# Patient Record
Sex: Female | Born: 1965 | Race: White | Hispanic: No | Marital: Married | Smoking: Never smoker
Health system: Southern US, Community
[De-identification: ages and names within clinical notes are randomized; demographics above are authoritative.]

## PROBLEM LIST (undated history)

## (undated) HISTORY — PX: TUBAL LIGATION: SHX77

---

## 2000-04-13 ENCOUNTER — Emergency Department (HOSPITAL_COMMUNITY): Admission: EM | Admit: 2000-04-13 | Discharge: 2000-04-13 | Payer: Self-pay | Admitting: *Deleted

## 2000-10-18 ENCOUNTER — Emergency Department (HOSPITAL_COMMUNITY): Admission: EM | Admit: 2000-10-18 | Discharge: 2000-10-18 | Payer: Self-pay

## 2000-12-14 ENCOUNTER — Emergency Department (HOSPITAL_COMMUNITY): Admission: EM | Admit: 2000-12-14 | Discharge: 2000-12-14 | Payer: Self-pay | Admitting: Emergency Medicine

## 2000-12-14 ENCOUNTER — Encounter: Payer: Self-pay | Admitting: Emergency Medicine

## 2001-09-03 ENCOUNTER — Emergency Department (HOSPITAL_COMMUNITY): Admission: EM | Admit: 2001-09-03 | Discharge: 2001-09-03 | Payer: Self-pay | Admitting: Emergency Medicine

## 2002-08-26 ENCOUNTER — Emergency Department (HOSPITAL_COMMUNITY): Admission: EM | Admit: 2002-08-26 | Discharge: 2002-08-26 | Payer: Self-pay | Admitting: Emergency Medicine

## 2004-11-24 ENCOUNTER — Emergency Department (HOSPITAL_COMMUNITY): Admission: EM | Admit: 2004-11-24 | Discharge: 2004-11-24 | Payer: Self-pay | Admitting: Emergency Medicine

## 2009-08-27 ENCOUNTER — Encounter: Admission: RE | Admit: 2009-08-27 | Discharge: 2009-08-27 | Payer: Self-pay | Admitting: Family Medicine

## 2009-09-05 ENCOUNTER — Encounter: Admission: RE | Admit: 2009-09-05 | Discharge: 2009-09-05 | Payer: Self-pay | Admitting: Family Medicine

## 2011-07-17 ENCOUNTER — Ambulatory Visit (INDEPENDENT_AMBULATORY_CARE_PROVIDER_SITE_OTHER): Payer: BC Managed Care – PPO

## 2011-07-17 DIAGNOSIS — H6500 Acute serous otitis media, unspecified ear: Secondary | ICD-10-CM

## 2011-07-17 DIAGNOSIS — H9209 Otalgia, unspecified ear: Secondary | ICD-10-CM

## 2012-04-15 ENCOUNTER — Ambulatory Visit (INDEPENDENT_AMBULATORY_CARE_PROVIDER_SITE_OTHER): Payer: BC Managed Care – PPO | Admitting: Family Medicine

## 2012-04-15 ENCOUNTER — Ambulatory Visit: Payer: BC Managed Care – PPO

## 2012-04-15 VITALS — BP 130/80 | HR 62 | Temp 98.4°F | Resp 16 | Ht 64.75 in | Wt 209.6 lb

## 2012-04-15 DIAGNOSIS — M79609 Pain in unspecified limb: Secondary | ICD-10-CM

## 2012-04-15 DIAGNOSIS — M79672 Pain in left foot: Secondary | ICD-10-CM

## 2012-04-15 MED ORDER — PREDNISONE 20 MG PO TABS: ORAL_TABLET | ORAL | Status: DC

## 2012-04-15 NOTE — Progress Notes (Signed)
46 yo woman with 3 weeks of left foot pain at outer area despite no trauma and no new activity.  Job is clerical.     Objective:  NAD Left foot is mildly swollen and tender over distal Vth metatarsal. [facility] reading (PRIMARY) by  Dr. Milus Glazier:  Left foot:  No fracture  Assessment: tendonitis  Plan:   Prednisone 20 mg ii qd x 5

## 2012-05-10 ENCOUNTER — Ambulatory Visit (INDEPENDENT_AMBULATORY_CARE_PROVIDER_SITE_OTHER): Payer: BC Managed Care – PPO | Admitting: Family Medicine

## 2012-05-10 VITALS — BP 123/80 | HR 80 | Temp 98.9°F | Resp 18 | Wt 212.0 lb

## 2012-05-10 DIAGNOSIS — M79609 Pain in unspecified limb: Secondary | ICD-10-CM

## 2012-05-10 DIAGNOSIS — M79673 Pain in unspecified foot: Secondary | ICD-10-CM

## 2012-05-10 NOTE — Patient Instructions (Addendum)
I think that you may have a neuroma of your foot.  I am going to refer you to see a sports medicine specialist.  In the meantime be sure to wear shoes with a wide toe- box to allow you foot plenty of space  Morton's Neuroma Neuralgia (nerve pain) or neuroma (benign [non-cancerous] nerve tumor) may develop on any interdigital nerve. The interdigital nerves (nerves between digits) of the foot travel beneath and between the metatarsals (long bones of the fore foot) and pass the nerve endings to the toes. The third interdigital is a common place for a small neuroma to form called Morton's neuroma. Another nerve to be affected commonly is the fourth interdigital nerve. This would be in approximately in the area of the base or ball under the bottom of your fourth toe. This condition occurs more commonly in women and is usually on one side. It is usually first noticed by pain radiating (spreading) to the ball of the foot or to the toes. CAUSES The cause of interdigital neuralgia may be from low grade repetitive trauma (damage caused by an accident) as in activities causing a repeated pounding of the foot (running, jumping etc.). It is also caused by improper footwear or recent loss of the fatty padding on the bottom of the foot. TREATMENT  The condition often resolves (goes away) simply with decreasing activity if that is thought to be the cause. Proper shoes are beneficial. Orthotics (special foot support aids) such as a metatarsal bar are often beneficial. This condition usually responds to conservative therapy, however if surgery is necessary it usually brings complete relief. HOME CARE INSTRUCTIONS   Apply ice to the area of soreness for 15 to 20 minutes, 3 to 4 times per day, while awake for the first 2 days. Put ice in a plastic bag and place a towel between the bag of ice and your skin.  Only take over-the-counter or prescription medicines for pain, discomfort, or fever as directed by your  caregiver. MAKE SURE YOU:   Understand these instructions.  Will watch your condition.  Will get help right away if you are not doing well or get worse. Document Released: 10/04/2000 Document Revised: 09/20/2011 Document Reviewed: 06/28/2005 [facility] Patient Information 2013 [place], [place].

## 2012-05-10 NOTE — Progress Notes (Signed)
[facility] [address] [place] [address] [phone]  Date:  05/10/2012   Name:  Mallory Bell   DOB:  [birth year]   MRN:  [id number]  PCP:  No primary provider on file.    Chief Complaint: Follow-up   History of Present Illness:  Mallory Bell is a 46 y.o. very pleasant female patient who presents with the following:  She was seen here on 04/15/12 with left foot pain without any trauma. She had a negative x-ray: LEFT FOOT - COMPLETE 3+ VIEW  Comparison: None.  Findings: No fracture. The joints are normally spaced and aligned.  The soft tissues are unremarkable.  IMPRESSION:  Normal left foot radiographs   Was diagnosed with tendonitis and treated with prednisone.  Since her last visit the foot has intermittently swollen.  It hurts a little bit, but this also comes and goes.   Her foot is worse at night after she has been on her feet She does not have pain when she gets out of bed in the am.   No new activities.  She has actually been trying to take it easier on her foot and this has not helped.    There is no problem list on file for this patient.   No past medical history on file.  Past Surgical History  Procedure Date  . Tubal ligation     History  Substance Use Topics  . Smoking status: Never Smoker   . Smokeless tobacco: Not on file  . Alcohol Use: No    Family History  Problem Relation Age of Onset  . Depression Mother   . Heart disease Father   . Diabetes Father   . Hypertension Father     No Known Allergies  Medication list has been reviewed and updated.  Current Outpatient Prescriptions on File Prior to Visit  Medication Sig Dispense Refill  . predniSONE (DELTASONE) 20 MG tablet 2 daily with food  10 tablet  1    Review of Systems:  As per HPI- otherwise negative.   Physical Examination: Filed Vitals:   05/10/12 0900  BP: 123/80  Pulse: 80  Temp: 98.9 F (37.2 C)  Resp: 18   Filed Vitals:   05/10/12 0900   Weight: 212 lb (96.163 kg)   There is no height on file to calculate BMI. Ideal Body Weight:    GEN: WDWN, NAD, Non-toxic, A & O x 3, overweight HEENT: Atraumatic, Normocephalic. Neck supple. No masses, No LAD. Ears and Nose: No external deformity. CV: RRR, No M/G/R. No JVD. No thrill. No extra heart sounds. PULM: CTA B, no wheezes, crackles, rhonchi. No retractions. No resp. distress. No accessory muscle use. EXTR: No c/c/e.  Hands, wrists normal (except ganglion cyst right).  Her right foot and ankle are normal.  Left foot is tender over the dorsal 3rd/ 4th metatarsals.  She has a positive squeeze test.  Feet are slightly flat but do have some arch preserved.  Trace edema at the lateral ankle but her ankle is not tender.  No heat, redness or swelling NEURO Normal gait.  PSYCH: Normally interactive. Conversant. Not depressed or anxious appearing.  Calm demeanor.    Assessment and Plan: 1. Foot pain  Ambulatory referral to Sports Medicine   See pt instructions   Abbe Amsterdam, MD

## 2012-05-16 ENCOUNTER — Encounter: Payer: Self-pay | Admitting: Family Medicine

## 2012-05-16 ENCOUNTER — Ambulatory Visit (INDEPENDENT_AMBULATORY_CARE_PROVIDER_SITE_OTHER): Payer: BC Managed Care – PPO | Admitting: Family Medicine

## 2012-05-16 VITALS — BP 144/81 | HR 76 | Temp 98.2°F | Ht 64.0 in | Wt 217.0 lb

## 2012-05-16 DIAGNOSIS — R269 Unspecified abnormalities of gait and mobility: Secondary | ICD-10-CM

## 2012-05-16 DIAGNOSIS — G5760 Lesion of plantar nerve, unspecified lower limb: Secondary | ICD-10-CM | POA: Insufficient documentation

## 2012-05-16 MED ORDER — MELOXICAM 15 MG PO TABS: 15.0000 mg | ORAL_TABLET | Freq: Every day | ORAL | Status: DC

## 2012-05-16 NOTE — Assessment & Plan Note (Signed)
Patient on clinical exam as well as ultrasound has findings consistent with a Morton's neuroma. Patient also has callus formation on the second large toe as well as a Morton's toe which increases the likelihood of this being a problem. Patient was put in sports insults today with a metatarsal pad bilaterally and did have some relief of the pain. Patient is going to try to wear this on a daily basis as well as taking meloxicam daily for the next 2 weeks. After that time patient will followup in 2-4 weeks for further evaluation. If she continues to be tender at that time we'll consider corticosteroid injection. If the sports insoles seem to be helping though then we'll consider doing custom orthotics.

## 2012-05-16 NOTE — Patient Instructions (Signed)
Very nice to meet you. I when she to try these insoles in some tennis shoes and see if this helps. I'm sending you in a medicine called meloxicam. Take one pill daily for the next 2 weeks then as needed thereafter. Come back in 4 weeks. If this seems to help then we will likely do custom orthotics. This did not seem to help then we need to consider doing an injection into the Morton's neuroma.  Morton's Neuroma in Sports  (Interdigital Plantar Neuroma) Morton's neuroma is a condition of the nervous system that results in pain or loss of feeling in the toes. The disease is caused by the bones of the foot squeezing the nerve that runs between two toes (interdigital nerve). The third and fourth toes are most likely to be affected by this disease. SYMPTOMS   Tingling, numbness, burning, or electric shocks in the front of the foot, often involving the third and fourth toes, although it may involve any other pair of toes.  Pain and tenderness in the front of the foot, that gets worse when walking.  Pain that gets worse when pressure is applied to the foot (wearing shoes).  Severe pain in the front of the foot, when standing on the front of the foot (on tiptoes), such as with running, jumping, pivoting, or dancing. CAUSES  Morton's neuroma is caused by swelling of the nerve between two toes. This swelling causes the nerve to be pinched between the bones of the foot. RISK INCREASES WITH:  Recurring foot or ankle injuries.  Poor fitting or worn shoes, with minimal padding and shock absorbers.  Loose ligaments of the foot, causing thickening of the nerve.  Poor foot strength and flexibility. PREVENTION  Warm up and stretch properly before activity.  Maintain physical fitness:  Foot and ankle flexibility.  Muscle strength and endurance.  Cardiovascular fitness.  Wear properly fitted and padded shoes.  Wear arch supports (orthotics), when needed. PROGNOSIS  If treated properly,  Morton's neuroma can usually be cured with non-surgical treatment. For certain cases, surgery may be needed. RELATED COMPLICATIONS  Permanent numbness and pain in the foot.  Inability to participate in athletics, because of pain. TREATMENT Treatment first involves stopping any activities that make the symptoms worse. The use of ice and medicine will help reduce pain and inflammation. Wearing shoes with a wide toe box, and an orthotic arch support or metatarsal bar, may also reduce pain. Your caregiver may give you a corticosteroid injection, to further reduce inflammation. If non-surgical treatment is unsuccessful, surgery may be needed. Surgery to fix Morton's neuroma is often performed as an outpatient procedure, meaning you can go home the same day as the surgery. The procedure involves removing the source of pressure on the nerve. If it is necessary to remove the nerve, you can expect persistent numbness. MEDICATION  If pain medicine is needed, nonsteroidal anti-inflammatory medicines (aspirin and ibuprofen), or other minor pain relievers (acetaminophen), are often advised.  Do not take pain medicine for 7 days before surgery.  Prescription pain relievers are usually prescribed only after surgery. Use only as directed and only as much as you need.  Corticosteroid injections are used in extreme cases, to reduce inflammation. These injections should be done only if necessary, because they may be given only a limited number of times. HEAT AND COLD  Cold treatment (icing) should be applied for 10 to 15 minutes every 2 to 3 hours for inflammation and pain, and immediately after activity that aggravates your  symptoms. Use ice packs or an ice massage.  Heat treatment may be used before performing stretching and strengthening activities prescribed by your caregiver, physical therapist, or athletic trainer. Use a heat pack or a warm water soak. SEEK MEDICAL CARE IF:   Symptoms get worse or do not  improve in 2 weeks, despite treatment.  After surgery you develop increasing pain, swelling, redness, increased warmth, bleeding, drainage of fluids, or fever.  New, unexplained symptoms develop. (Drugs used in treatment may produce side effects.) Document Released: 05/05/2005 Document Revised: 09/20/2011 Document Reviewed: 10/10/2008 [facility] Patient Information 2013 [place], [place].

## 2012-05-16 NOTE — Progress Notes (Signed)
Subjective Patient is a very pleasant 46 year old female who is coming in with left foot pain for the last 5 weeks. Patient states she does not remember any injury. Patient states that the pain appears to be mostly on the dorsal aspect of the foot. Patient states that it is a very specific area that does have some swelling and even some numbness of her toes distal to the area. Patient has seen other physicians and has been diagnosed with a tendon rupture as well as potential Morton's neuroma. Vision is here though for further evaluation and treatment. Patient has had different things such as changing shoes which did not make any difference as well as icing and anti-inflammatories with no significant improvement. Patient is able to walk but does have pain from time to time. Patient states that the pain is much worse with palpation of the area or increase walking duration. Patient denies any injury to the area.patient describes pain as a very sharp sensation that does have some mild radiation that is worse once again with palpation. Patient denies anything seems to help.  Past medical history No past medical history on file. Past Surgical History  Procedure Date  . Tubal ligation    Family History  Problem Relation Age of Onset  . Depression Mother   . Heart disease Father   . Diabetes Father   . Hypertension Father    History  Substance Use Topics  . Smoking status: Never Smoker   . Smokeless tobacco: Not on file  . Alcohol Use: No   Allergies  Allergen Reactions  . Amoxicillin Rash   Current outpatient prescriptions:meloxicam (MOBIC) 15 MG tablet, Take 1 tablet (15 mg total) by mouth daily., Disp: 30 tablet, Rfl: 2;  predniSONE (DELTASONE) 20 MG tablet, 2 daily with food, Disp: 10 tablet, Rfl: 1   Physical exam Blood pressure 144/81, pulse 76, temperature 98.2 F (36.8 C), temperature source Oral, height 5\' 4"  (1.626 m), weight 217 lb (98.431 kg), last menstrual period  05/03/2012. General:No apparent distress alert and oriented x3, very pleasant mood and affect Skin: Clean dry intact with no signs of infection a rash Neuro: Cranial nerves II through XII intact as well as neurovascularly intact in all extremities with 2+ DTRs. Respiratory: Patient is in full sentences and does not appear short of breath Left foot exam: Patient does have what appears on exam to be a lipoma in the peroneal tendon just posterior and inferior to the lateral malleolus. This measures approximately 1 cm in diameter. This is nontender to exam. Patient has full range of motion of her ankle and is nontender overall bony prominence. Patient is significantly though tender between toes 3 and 4 on the dorsal aspect of the foot. Patient has a positive squeeze test of the foot. Patient does have trace effusion of the dorsal aspect of the foot in this vicinity. Patient is neurovascularly intact distally. Patient does have collection of the transverse arch as well as some on the longitudinal arch. Patient does not have any overpronation over supination though.  Ultrasound was done and interpreted by me today. Patient's ultrasound does show a Morton's neuroma with hypoechoic changes surrounding it and is significant enlargement between the third and fourth metatarsals. This area is very tender on exam patient does have a positive Mulder sign.

## 2012-06-13 ENCOUNTER — Ambulatory Visit: Payer: BC Managed Care – PPO | Admitting: Family Medicine

## 2012-07-18 ENCOUNTER — Ambulatory Visit: Payer: Self-pay | Admitting: Family Medicine

## 2012-09-05 ENCOUNTER — Ambulatory Visit (INDEPENDENT_AMBULATORY_CARE_PROVIDER_SITE_OTHER): Payer: BC Managed Care – PPO | Admitting: Family Medicine

## 2012-09-05 VITALS — BP 120/70 | Ht 65.0 in | Wt 200.0 lb

## 2012-09-05 DIAGNOSIS — R269 Unspecified abnormalities of gait and mobility: Secondary | ICD-10-CM

## 2012-09-05 NOTE — Progress Notes (Signed)
Patient is following up for bilateral foot pain.  Subjective: Patient was seen previously in May 16, 2012 and was diagnosed with a Morton's neuroma on physical exam in muscle skeletal ultrasound. Patient was fitted with temporary sports insoles with metatarsal pads and had near resolution of pain. Since this time patient sports and soles has broken down but she still is having considerable less pain than she ever had prior to first visit. Patient is here for custom orthotics. Patient is not taking any medications at this time.  Physical exam Blood pressure 120/70, height 5\' 5"  (1.651 m), weight 200 lb (90.719 kg). General:No apparent distress alert and oriented x3, very pleasant mood and affect  Skin: Clean dry intact with no signs of infection a rash  Neuro: Cranial nerves II through XII intact as well as neurovascularly intact in all extremities with 2+ DTRs.   Left foot exam: Nontender to exam. Patient has full range of motion of her ankle and is nontender overall bony prominence. Patient has no effusion which was previously seen. Patient is neurovascularly intact distally. Patient does have collection of the transverse arch as well as some on the longitudinal  Barefoot patient walking does have some overpronation of the hindfoot with some mild external rotation of the right greater than left leg.  Patient was fitted for a : standard, cushioned, semi-rigid orthotic. The orthotic was heated and afterward the patient stood on the orthotic blank positioned on the orthotic stand. The patient was positioned in subtalar neutral position and 10 degrees of ankle dorsiflexion in a weight bearing stance. After completion of molding, a stable base was applied to the orthotic blank. The blank was ground to a stable position for weight bearing. Size:8 Base:EVA Posting: metatarsal pad bilaterally.  Additional orthotic padding: none  Patient ambulated a very neutral position with no significant  overpronation once placed in the orthotic. Patient stated that this felt very comfortable.

## 2012-09-05 NOTE — Assessment & Plan Note (Signed)
Patient to do with custom orthotics today. Patient will wear these and come back in the next 2 weeks if any adjustments need to be done. Encourage to increase the length of use one hour per day over the course of the next 7-10 days.

## 2012-09-09 ENCOUNTER — Ambulatory Visit (INDEPENDENT_AMBULATORY_CARE_PROVIDER_SITE_OTHER): Payer: BC Managed Care – PPO | Admitting: Family Medicine

## 2012-09-09 VITALS — BP 137/81 | HR 96 | Temp 101.8°F | Resp 16 | Ht 64.5 in | Wt 211.0 lb

## 2012-09-09 DIAGNOSIS — J020 Streptococcal pharyngitis: Secondary | ICD-10-CM

## 2012-09-09 MED ORDER — AZITHROMYCIN 250 MG PO TABS: ORAL_TABLET | ORAL | Status: DC

## 2012-09-09 NOTE — Progress Notes (Signed)
[facility] [address], [place] [address] [phone]  Date:  09/09/2012   Name:  Mallory Bell   DOB:  1966-03-16   MRN:  [id number]  PCP:  No primary provider on file.    Chief Complaint: Fever   History of Present Illness:  Mallory Bell is a 47 y.o. very pleasant female patient who presents with the following:  She is here today with possible strep. Today is Saturday. She was with her daughter on Wednesday of this week- then her daughter was diagnosed with strep throat yesterday. Last night Lashan noted that she was strarting to get sick- she did not sleep well and felt achy.    She noted a ST but took ibuprofen this am which did help She feesl nauseated but no vomiting No cough No abdominal pain She does have a fever and a HA- her temp at home was 100.9- she took some ibuprofen- 600 mg around 0800 today She is otherwise generally healthy Patient Active Problem List  Diagnosis  . Morton's neuroma  . Abnormality of gait    No past medical history on file.  Past Surgical History  Procedure Laterality Date  . Tubal ligation      History  Substance Use Topics  . Smoking status: Never Smoker   . Smokeless tobacco: Not on file  . Alcohol Use: No    Family History  Problem Relation Age of Onset  . Depression Mother   . Bipolar disorder Mother   . Heart disease Father   . Diabetes Father   . Hypertension Father   . Hypertension Maternal Grandmother   . Thyroid disease Maternal Grandmother   . Heart disease Maternal Grandfather   . Hypertension Paternal Grandmother     Allergies  Allergen Reactions  . Amoxicillin Rash    Medication list has been reviewed and updated.  Current Outpatient Prescriptions on File Prior to Visit  Medication Sig Dispense Refill  . meloxicam (MOBIC) 15 MG tablet Take 1 tablet (15 mg total) by mouth daily.  30 tablet  2  . predniSONE (DELTASONE) 20 MG tablet 2 daily with food  10 tablet  1   No current  facility-administered medications on file prior to visit.    Review of Systems:  As per HPI- otherwise negative.   Physical Examination: Filed Vitals:   09/09/12 1021  BP: 137/81  Pulse: 96  Temp: 101.8 F (38.8 C)  Resp: 16   Filed Vitals:   09/09/12 1021  Height: 5' 4.5" (1.638 m)  Weight: 211 lb (95.709 kg)   Body mass index is 35.67 kg/(m^2). Ideal Body Weight: Weight in (lb) to have BMI = 25: 147.6  GEN: WDWN, NAD, Non-toxic, A & O x 3, overweight HEENT: Atraumatic, Normocephalic. Neck supple. No masses, No LAD.  Bilateral TM wnl, oropharynx shows injection and tonsilar swelling on the right.  No to minimal exudate, no sign of abscess.  PEERL,EOMI.   Ears and Nose: No external deformity. CV: RRR, No M/G/R. No JVD. No thrill. No extra heart sounds. PULM: CTA B, no wheezes, crackles, rhonchi. No retractions. No resp. distress. No accessory muscle use. ABD: S, NT, ND. No rebound. No HSM. EXTR: No c/c/e NEURO Normal gait.  PSYCH: Normally interactive. Conversant. Not depressed or anxious appearing.  Calm demeanor.   Pt was not able to tolerate a throat swab (she was afraid of vomiting), and she declined any tylenol today for her fever Assessment and Plan:  Streptococcal sore throat - Plan: azithromycin (ZITHROMAX) 250 MG tablet  Treat for presumptive strep throat today- we were unable to get a throat swab today.  Amoxicillin allergy so will treat with azithromycin  See patient instructions for more details.      Abbe Amsterdam, MD

## 2012-09-09 NOTE — Patient Instructions (Addendum)
Use the antibiotic as directed, and use ibuprofen and /or tylenol as needed for your fever and other symptoms.  If you are not better within 36 hours please let me know- Sooner if worse.   Push fluids and rest.  Stay away from others for at least 24 hours

## 2012-11-08 ENCOUNTER — Ambulatory Visit (INDEPENDENT_AMBULATORY_CARE_PROVIDER_SITE_OTHER): Payer: BC Managed Care – PPO | Admitting: Family Medicine

## 2012-11-08 VITALS — BP 122/76 | HR 78 | Temp 99.4°F | Resp 18 | Ht 64.5 in | Wt 213.0 lb

## 2012-11-08 DIAGNOSIS — J209 Acute bronchitis, unspecified: Secondary | ICD-10-CM

## 2012-11-08 DIAGNOSIS — J208 Acute bronchitis due to other specified organisms: Secondary | ICD-10-CM

## 2012-11-08 MED ORDER — HYDROCODONE-HOMATROPINE 5-1.5 MG/5ML PO SYRP: 5.0000 mL | ORAL_SOLUTION | ORAL | Status: DC | PRN

## 2012-11-08 MED ORDER — BENZONATATE 100 MG PO CAPS: ORAL_CAPSULE | ORAL | Status: DC

## 2012-11-08 MED ORDER — AZITHROMYCIN 250 MG PO TABS: ORAL_TABLET | ORAL | Status: DC

## 2012-11-08 NOTE — Progress Notes (Signed)
Subjective: 47 year old lady with history of having an upper sparked or infections started over the weekend. Monday she began coughing badly. The cough keeps her awake. She feels like stuff is there in her throat but just won't come up. She does not smoke. She does have a history of moderate number of these infections. No one else at home is sick. She's not bringing up much phlegm. She does feel rattling and wheezing.  Objective: Her left TM is normal. Right is partially occluded by cerumen and although she says that she has a small hole in the trauma cannot see it. Her. Chest is clear to auscultation except for when she does forced expiration there is a little rattling over the trachea. Heart regular without murmurs.  Assessment Viral URI and bronchitis  Plan: Treat symptomatically. Off work today. If not improving over the next several days take Zithromax, but don't think this at this time hopefully antibiotics can be avoided.

## 2012-11-08 NOTE — Patient Instructions (Addendum)
Drink lots of fluids and get enough rest  Off work today  Use the cough medicines as directed  Take an OTC antihistamine (claritin, zyrtec, allegra to reduce drainage)  If not improving over next 3 days take the Zithromax.  Otherwise discard the prescription.

## 2013-02-13 ENCOUNTER — Ambulatory Visit: Payer: BC Managed Care – PPO

## 2013-02-13 ENCOUNTER — Other Ambulatory Visit: Payer: Self-pay | Admitting: Family Medicine

## 2013-02-13 ENCOUNTER — Ambulatory Visit (INDEPENDENT_AMBULATORY_CARE_PROVIDER_SITE_OTHER): Payer: BC Managed Care – PPO | Admitting: Family Medicine

## 2013-02-13 ENCOUNTER — Ambulatory Visit (HOSPITAL_COMMUNITY)
Admission: RE | Admit: 2013-02-13 | Discharge: 2013-02-13 | Disposition: A | Discharge status: Home / Self Care | Payer: BC Managed Care – PPO | Source: Ambulatory Visit | Attending: Family Medicine | Admitting: Family Medicine

## 2013-02-13 VITALS — BP 112/72 | HR 60 | Temp 98.0°F | Resp 18 | Ht 65.0 in | Wt 219.0 lb

## 2013-02-13 DIAGNOSIS — M25569 Pain in unspecified knee: Secondary | ICD-10-CM

## 2013-02-13 DIAGNOSIS — M25561 Pain in right knee: Secondary | ICD-10-CM

## 2013-02-13 DIAGNOSIS — M79609 Pain in unspecified limb: Secondary | ICD-10-CM | POA: Insufficient documentation

## 2013-02-13 MED ORDER — MELOXICAM 7.5 MG PO TABS: 7.5000 mg | ORAL_TABLET | Freq: Every day | ORAL | Status: DC

## 2013-02-13 NOTE — Progress Notes (Signed)
[facility] [address] [place] [address] [phone]  Date:  02/13/2013   Name:  Mallory Bell   DOB:  1966-03-09   MRN:  [id number]  PCP:  Nilda Simmer, MD    Chief Complaint: Leg Pain   History of Present Illness:  Mallory Bell is a 47 y.o. very pleasant female patient who presents with the following:  She is here with right leg pain.  She has pain in the medial knee.  She has noted this for a couple of days, but is not aware of any injury, contusion, or twist.  The pain comes and goes, and is sharp and acute when it occurs. The knee does not feel unstable.  However, she did feel like it was swollen along the medial joint line last night- now resolved.   No clicking or popping.    No history of DVT/ PE, no travel Never a smoker, not on any hormones.    Otherwise she feels well, no SOB, no CP, no fever.  No prior history of problems with her knee. No unusual exercise.  She does exercise about 3x a week- she will walk on a treadmill or use the elliptical machine, no issues in the past.    She has had some issues with her left foot in the past, but her foot is ok now.   She is otherwise generally healthy except for overweight  Patient Active Problem List   Diagnosis Date Noted  . Morton's neuroma 05/16/2012  . Abnormality of gait 05/16/2012    History reviewed. No pertinent past medical history.  Past Surgical History  Procedure Laterality Date  . Tubal ligation      History  Substance Use Topics  . Smoking status: Never Smoker   . Smokeless tobacco: Never Used  . Alcohol Use: No    Family History  Problem Relation Age of Onset  . Depression Mother   . Bipolar disorder Mother   . Heart disease Father   . Diabetes Father   . Hypertension Father   . Hypertension Maternal Grandmother   . Thyroid disease Maternal Grandmother   . Heart disease Maternal Grandfather   . Hypertension Paternal Grandmother     Allergies  Allergen Reactions   . Amoxicillin Rash    Medication list has been reviewed and updated.  Current Outpatient Prescriptions on File Prior to Visit  Medication Sig Dispense Refill  . azithromycin (ZITHROMAX) 250 MG tablet Take 2 pills once, then 1 pill a day for 4 days more  6 tablet  0  . benzonatate (TESSALON) 100 MG capsule Use 1-2 tablets 3 times daily as necessary for cough. May be used with other cough medicines if needed.  30 capsule  0  . HYDROcodone-homatropine (HYCODAN) 5-1.5 MG/5ML syrup Take 5 mLs by mouth every 4 (four) hours as needed for cough.  120 mL  0   No current facility-administered medications on file prior to visit.    Review of Systems:  As per HPI- otherwise negative.   Physical Examination: Filed Vitals:   02/13/13 0813  BP: 112/72  Pulse: 60  Temp: 98 F (36.7 C)  Resp: 18   Filed Vitals:   02/13/13 0813  Height: 5\' 5"  (1.651 m)  Weight: 219 lb (99.338 kg)   Body mass index is 36.44 kg/(m^2). Ideal Body Weight: Weight in (lb) to have BMI = 25: 149.9  GEN: WDWN, NAD, Non-toxic, A & O x 3, obese HEENT: Atraumatic,  Normocephalic. Neck supple. No masses, No LAD. Ears and Nose: No external deformity. CV: RRR, No M/G/R. No JVD. No thrill. No extra heart sounds. PULM: CTA B, no wheezes, crackles, rhonchi. No retractions. No resp. distress. No accessory muscle use. EXTR: No c/c/e NEURO Normal gait.  PSYCH: Normally interactive. Conversant. Not depressed or anxious appearing.  Calm demeanor.  Right knee: no effusion or redness.  Mild tenderness along the medial joint line and medial knee area.  Knee is stable, normal ROM.  Normal quad and hamstring strength and patellar DTR  [facility] reading (PRIMARY) by  Dr. Patsy Lager. Right knee: minimal degenerative change, no acute fracture RIGHT KNEE - 3 VIEW  Comparison: None.  Findings: No fracture or bone lesion. The knee joint is normally spaced and aligned. Evidence of a subchondral cyst along the dorsal patella with a minimal  the inferior marginal patellar osteophyte. No other degenerative changes evident. The lateral view is somewhat rotated. Allowing for this, there is no convincing joint effusion.  IMPRESSION: Mild patellofemoral joint space compartment degenerative change. No other abnormalities.  Clinically significant discrepancy from primary report, if provided: None  Assessment and Plan: Pain in right knee - Plan: Lower Extremity Venous Duplex Right, meloxicam (MOBIC) 7.5 MG tablet, CANCELED: DG Knee Complete 4 Views Right  Knee pain- suspect a strain of the MCL, also consider meniscal pathology.  Will rule- out DVT and baker's cyst with an ultrasound today.  Placed in hinged knee brace, mobic as needed. Plan to have her call or email if not better in 1 week or so- in this case I will refer to ortho.  Let me know sooner if worse.    Signed Abbe Amsterdam, MD  Received her ultrasound report- negative for DVT or Baker's cyst.  Let her know these results. Continue plan as above.

## 2013-02-13 NOTE — Progress Notes (Signed)
*  PRELIMINARY RESULTS* Vascular Ultrasound Right lower extremity venous duplex has been completed.  Preliminary findings: negative for DVT and baker's cyst.  Called report to Dr. Patsy Lager.    Farrel Demark, RDMS, RVT  02/13/2013, 11:17 AM

## 2013-02-13 NOTE — Patient Instructions (Addendum)
You may go today to [facility] today for the Ultrasound. You may park in the [place] section A entrance (use valet parking) if you go now you will be worked in for the 10 am appt. I will call you with your results later on today .  Use the mobic as needed for pain, and wear the knee brace as needed.  You may also use ice on your knee.  If you are not better in the next week or two, please let me know Driving directions to [facility] [id number]  [phone]  - more info    [address]  [place], [place] [address]     1. Head south on [address] toward [address]      0.5 mi    2. Sharp left onto [address]      0.6 mi    3. Turn left onto the [address] ramp      0.2 mi    4. Merge onto [address]      3.0 mi    5. Continue straight to stay on [address] E      0.4 mi    6. Slight left to stay on [address] E      1.2 mi    7. Turn right onto [address]      0.1 mi    8. Turn left onto [address]      361 ft    9. Take the 1st left onto [address]  Destination will be on the right

## 2013-02-22 ENCOUNTER — Telehealth: Payer: Self-pay

## 2013-02-22 NOTE — Telephone Encounter (Signed)
LMOM for pt to CB. Dr Patsy Lager requests that we ask pt for more details about knee status. Also ask pt if she has any metal in her body, aneurism or device, or chance of pregnancy. Advise pt tha ins probably will not want to cover MRI until after 4-6 wks of conservative tx, but we can try or could refer to ortho who could prob get MRI approved.

## 2013-02-22 NOTE — Telephone Encounter (Signed)
Dr Patsy Lager,   The knee is no better.   Patient is requesting an order for MRI.   CBN:  [phone]

## 2013-02-23 ENCOUNTER — Telehealth: Payer: Self-pay

## 2013-02-23 DIAGNOSIS — M25561 Pain in right knee: Secondary | ICD-10-CM

## 2013-02-23 NOTE — Telephone Encounter (Signed)
She does prefer ortho, this is duplicate message.

## 2013-02-23 NOTE — Telephone Encounter (Signed)
Sent ortho referral.

## 2013-02-23 NOTE — Telephone Encounter (Signed)
Called again- LM.  Please give [place] a call or email with her preference- MRI order or ortho referral

## 2013-02-23 NOTE — Telephone Encounter (Signed)
Patient called in with a message for Dr. Ermalinda Barrios in response to a phone message Dr. Ermalinda Barrios left for her.  She stated that going to the ortho before an MRI would be fine.  She asked if she could be referred to the same ortho that she went to before for her foot.  The ortho was through [facility] off of [address].  Her number is [phone].

## 2013-03-05 ENCOUNTER — Ambulatory Visit (INDEPENDENT_AMBULATORY_CARE_PROVIDER_SITE_OTHER): Payer: BC Managed Care – PPO | Admitting: Family Medicine

## 2013-03-05 VITALS — BP 145/86 | Ht 64.0 in | Wt 200.0 lb

## 2013-03-05 DIAGNOSIS — M25569 Pain in unspecified knee: Secondary | ICD-10-CM

## 2013-03-05 DIAGNOSIS — R269 Unspecified abnormalities of gait and mobility: Secondary | ICD-10-CM

## 2013-03-05 DIAGNOSIS — M25561 Pain in right knee: Secondary | ICD-10-CM | POA: Insufficient documentation

## 2013-03-05 NOTE — Progress Notes (Signed)
  Subjective:    Patient ID: Mallory Bell, female    DOB: Nov 09, 1965, 47 y.o.   MRN: [id number]  HPI  Pain for several weeks. No specific inciting injury. Has never had injury to the right knee. She is not particularly active in sports. Has not started any new walking or other activity program. She has a desk job.  Pain is at the medial side of the right knee. She's been wearing a brace and that seems to help. If she wears the brace for 3 or 4 days then she can go a couple days without the brace and without pain then the pain begins to return. No locking or giving way. No knee swelling. No knee warmth.  Review of Systems No fever, sweats, chills. No unusual weight change.    Objective:   Physical Exam  Vital signs are reviewed GENERAL: Well-developed overweight female no acute distress knee: Right. Full range of motion flexion and extension. Mildly tender to palpation over the origin of the MCL. Joint line is nontender on either side. Negative in very. Normal ligamentous stability to varus and valgus stress. Popliteal space is benign. Is benign. ; Small amount of fluid immediately under the MCL which appears to be intact. The rest of the knee are sounds normal including lateral hand medial meniscus.      Assessment & Plan:  #1 pre-MCL pain/strain on the right. We'll continue brace during the day. Start her on general knee rehabilitation and see her back in 2-3 weeks.

## 2013-03-19 ENCOUNTER — Encounter: Payer: Self-pay | Admitting: Family Medicine

## 2013-03-19 ENCOUNTER — Ambulatory Visit (INDEPENDENT_AMBULATORY_CARE_PROVIDER_SITE_OTHER): Payer: BC Managed Care – PPO | Admitting: Family Medicine

## 2013-03-19 VITALS — BP 132/84 | HR 62 | Ht 64.5 in | Wt 219.0 lb

## 2013-03-19 DIAGNOSIS — M25561 Pain in right knee: Secondary | ICD-10-CM

## 2013-03-19 DIAGNOSIS — M25569 Pain in unspecified knee: Secondary | ICD-10-CM

## 2013-03-21 NOTE — Progress Notes (Signed)
  Subjective:    Patient ID: Mallory Bell, female    DOB: July 25, 1965, 47 y.o.   MRN: [id number]  HPI Followup right knee pain thought to be MCL strain. The brace ecstasy making things worse so she worked for for 5 days and then stopped it. Meloxicam was bothering her stomach so she switched to 800 mg ibuprofen twice a day. She's intermittently been doing the home exercise program but it also seemed to make it more sore the next morning so she's done every other day. In general she is about 50% improved   Review of Systems No locking or giving way of the right knee.    Objective:   Physical Exam  Vital signs reviewed GENERAL: Well-developed overweight female no acute distress KNEE: Right. No effusion. Very mildly tender to palpation over the MCL area ligamentously intact to varus and valgus stress. Lockman is normal no effusion.      Assessment & Plan:  Grade 1 MCL strain improving. I would continue to advance the home exercise program until she can fully completed and all her symptoms have resolved. She can followup when necessary. I think is fine to use 8 her milligrams ibuprofen twice a day problem for the total of 8 weeks when she has stomach issues.

## 2014-05-08 IMAGING — CR DG FOOT COMPLETE 3+V*L*
2 series · 2 of 2 positions shown · non-contrast
Comparison: None.

CLINICAL DATA: Left foot pain for 2 weeks

LEFT FOOT - COMPLETE 3+ VIEW

[AP]
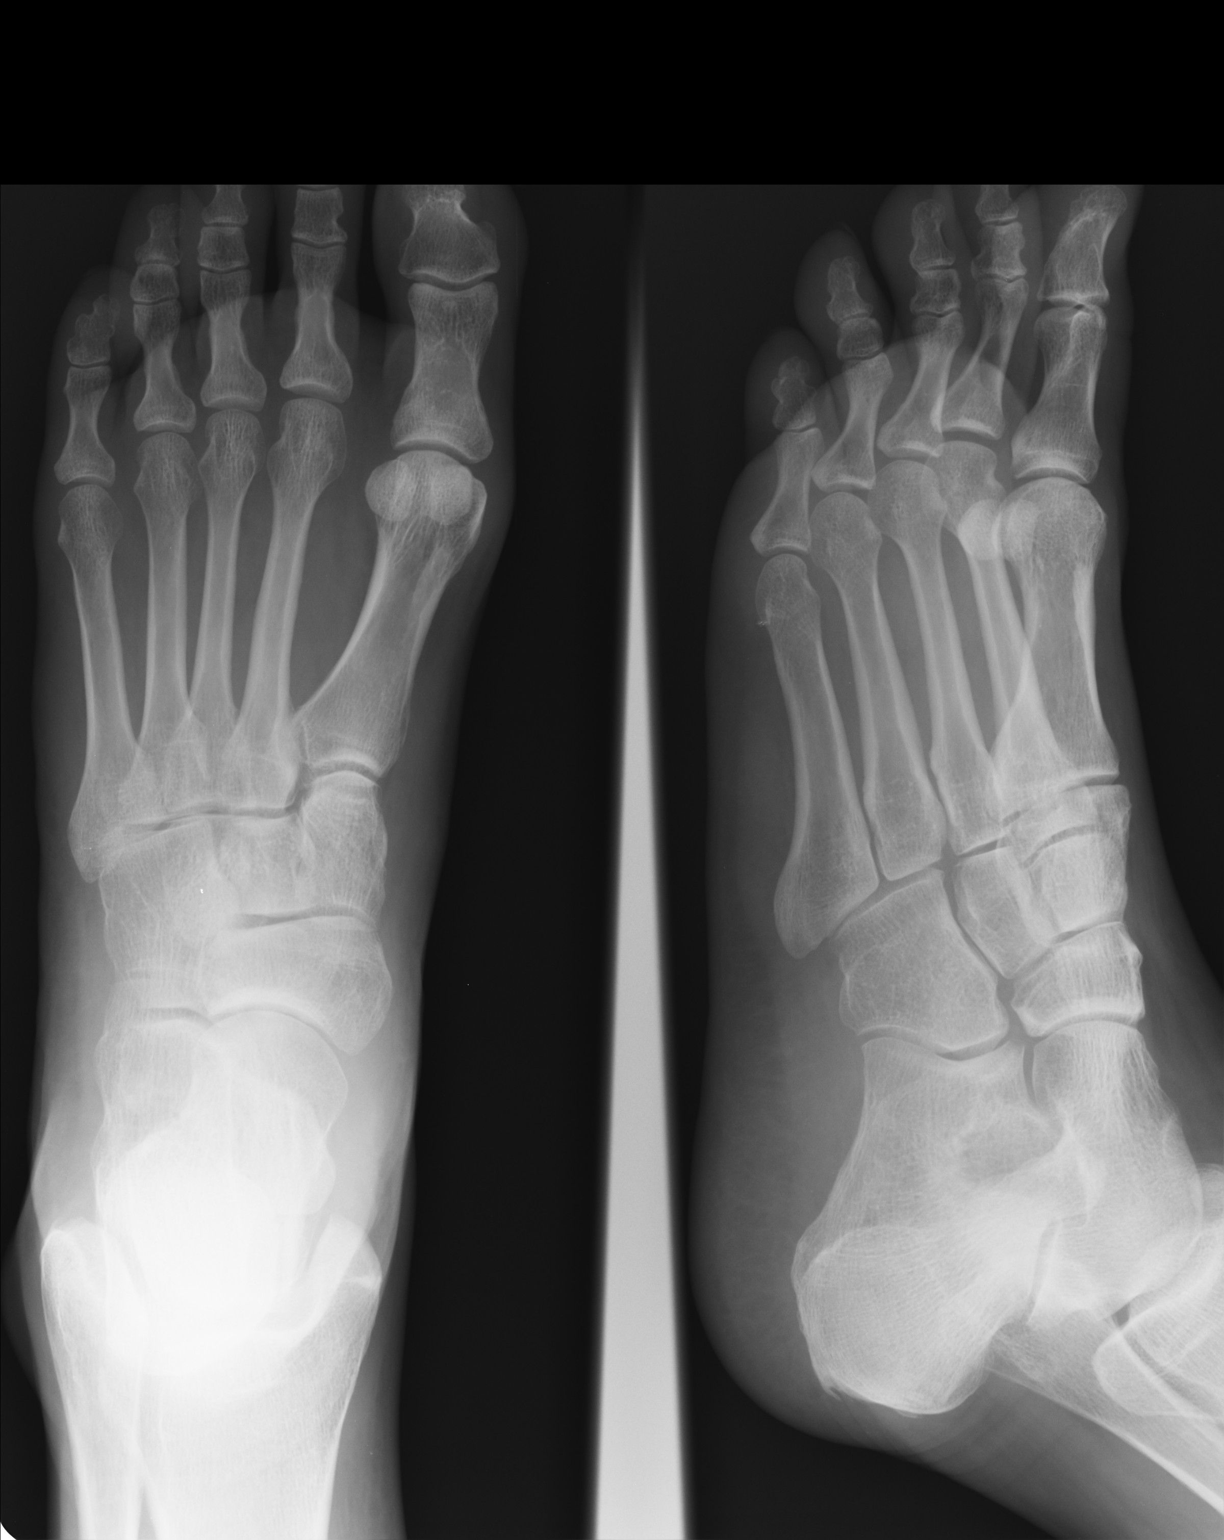

[lateral]
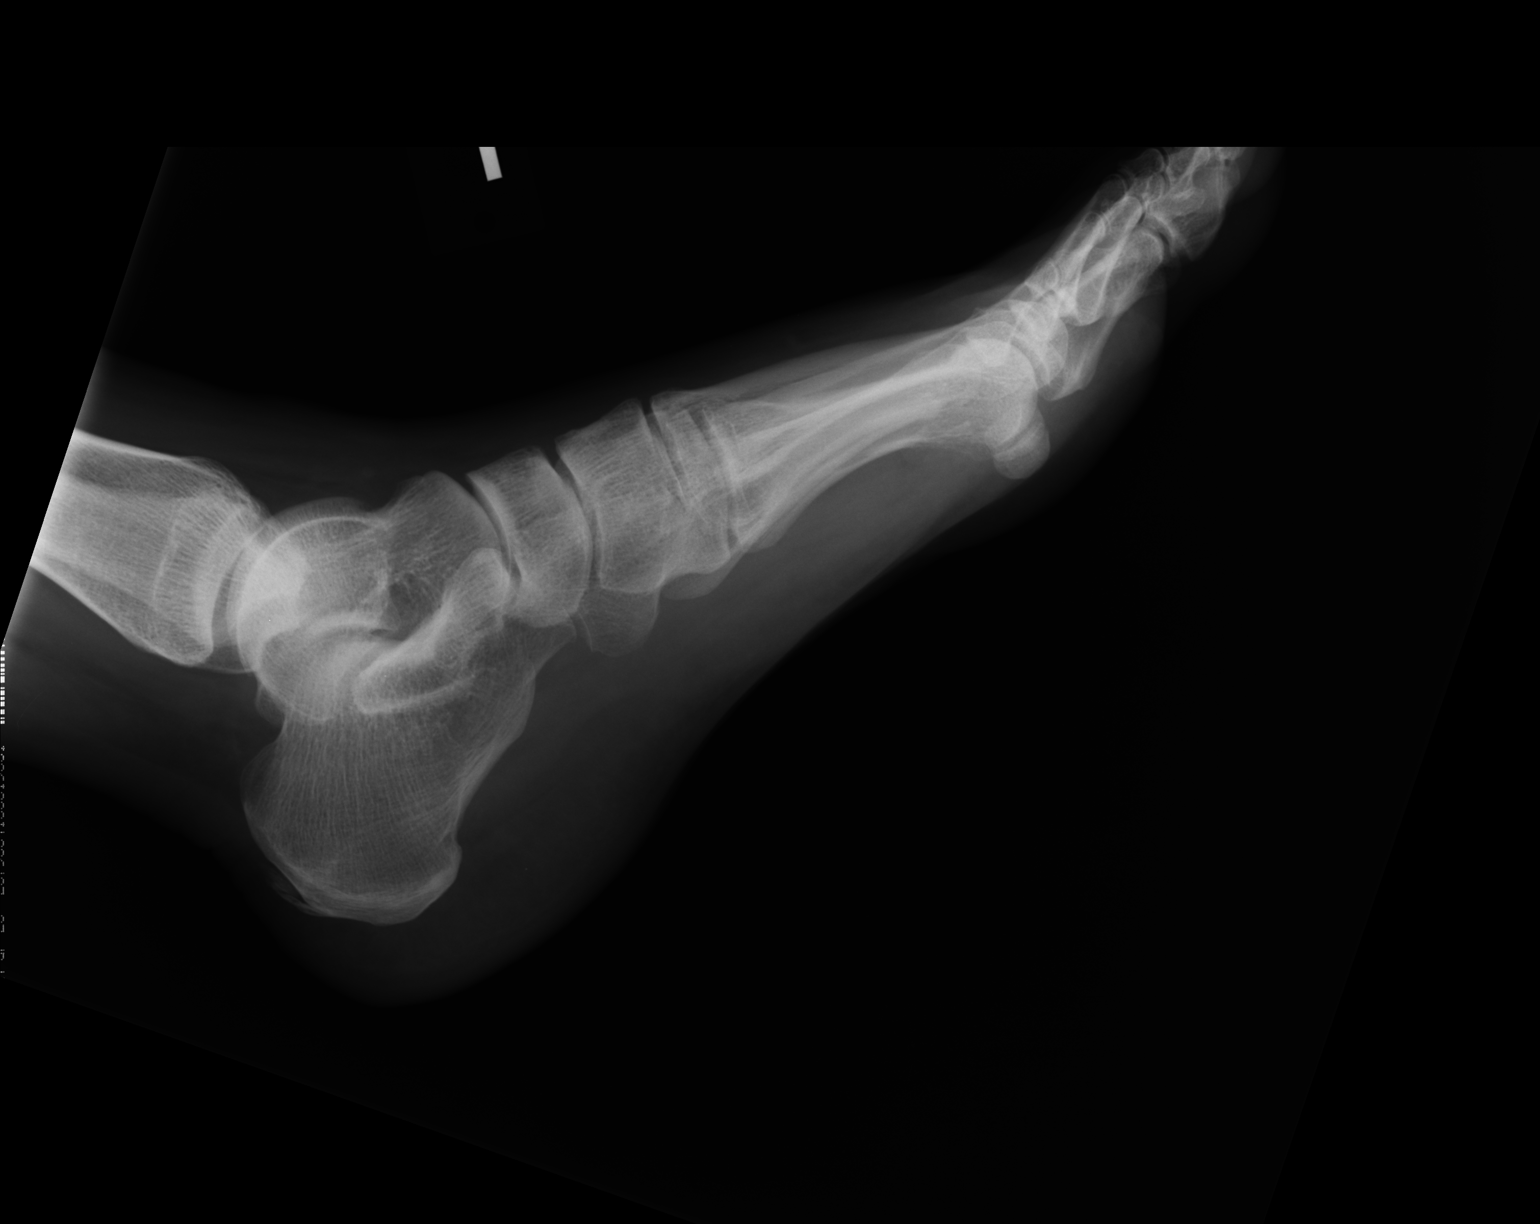

[2 of 2 positions shown; findings below may reference images not displayed]

FINDINGS: No fracture.  The joints are normally spaced and aligned.
The soft tissues are unremarkable.
IMPRESSION: Normal left foot radiographs

## 2014-12-30 ENCOUNTER — Ambulatory Visit (INDEPENDENT_AMBULATORY_CARE_PROVIDER_SITE_OTHER): Payer: BLUE CROSS/BLUE SHIELD | Admitting: Physician Assistant

## 2014-12-30 VITALS — BP 122/68 | HR 70 | Temp 98.4°F | Resp 17 | Ht 64.5 in | Wt 222.0 lb

## 2014-12-30 DIAGNOSIS — R319 Hematuria, unspecified: Secondary | ICD-10-CM | POA: Diagnosis not present

## 2014-12-30 DIAGNOSIS — N3090 Cystitis, unspecified without hematuria: Secondary | ICD-10-CM | POA: Diagnosis not present

## 2014-12-30 LAB — POCT URINALYSIS DIPSTICK
BILIRUBIN UA: NEGATIVE
GLUCOSE UA: NEGATIVE
KETONES UA: NEGATIVE
Nitrite, UA: NEGATIVE
Protein, UA: 30
SPEC GRAV UA: 1.02
UROBILINOGEN UA: 0.2
pH, UA: 6

## 2014-12-30 LAB — POCT UA - MICROSCOPIC ONLY
CASTS, UR, LPF, POC: NEGATIVE
CRYSTALS, UR, HPF, POC: NEGATIVE
Mucus, UA: POSITIVE
Yeast, UA: NEGATIVE

## 2014-12-30 MED ORDER — NITROFURANTOIN MONOHYD MACRO 100 MG PO CAPS: 100.0000 mg | ORAL_CAPSULE | Freq: Two times a day (BID) | ORAL | Status: AC

## 2014-12-30 NOTE — Patient Instructions (Signed)
Drink plenty of water and cranberry juice. Take macrobid twice a day for 10 days. I will call you with results of your urine culture. If you are not getting better in 5-7 days, let me know.

## 2014-12-30 NOTE — Progress Notes (Signed)
[facility] [address] [place] [address] [phone]  Date:  12/30/2014   Name:  Mallory Bell   DOB:  02-12-1966   MRN:  [id number]  PCP:  Nilda Simmer, MD    Chief Complaint: Dysuria   History of Present Illness:  This is a 49 y.o. female who is presenting with hematuria x 1 day. States last night she wiped and saw blood mixed in with her urine. This was very worrisome for her. States it is normal for her to have hematuria with UTIs but usually this is a later symptom. She usually gets dysuria and urinary frequency first and she does have those sx this time. She denies fever, chills, n/v/d, vaginal discharge, new back pain, abdominal pain. Her LMP was 12/09/14. States she did some investigating to see if she was starting her period early but states she is pretty certain the blood is coming from her urethra. She is sexually active with her husband of 22 years. States several years ago she was getting back to back UTIs. She states drinking cranberry juice and apple cider vinegar usually will get rid of her UTI sx - she hasn't had to come in for a UTI in over 3 years.  Review of Systems:  Review of Systems See HPI  Patient Active Problem List   Diagnosis Date Noted  . Right knee pain 03/05/2013  . Morton's neuroma 05/16/2012  . Abnormality of gait 05/16/2012    Prior to Admission medications   Not on File    Allergies  Allergen Reactions  . Amoxicillin Rash    Past Surgical History  Procedure Laterality Date  . Tubal ligation      History  Substance Use Topics  . Smoking status: Never Smoker   . Smokeless tobacco: Never Used  . Alcohol Use: No    Family History  Problem Relation Age of Onset  . Depression Mother   . Bipolar disorder Mother   . Heart disease Father   . Diabetes Father   . Hypertension Father   . Hypertension Maternal Grandmother   . Thyroid disease Maternal Grandmother   . Heart disease Maternal Grandfather   .  Hypertension Paternal Grandmother     Medication list has been reviewed and updated.  Physical Examination:  Physical Exam  Constitutional: She is oriented to person, place, and time. She appears well-developed and well-nourished. No distress.  HENT:  Head: Normocephalic and atraumatic.  Right Ear: Hearing normal.  Left Ear: Hearing normal.  Nose: Nose normal.  Eyes: Conjunctivae and lids are normal. Right eye exhibits no discharge. Left eye exhibits no discharge. No scleral icterus.  Cardiovascular: Normal rate, regular rhythm, normal heart sounds and normal pulses.   No murmur heard. Pulmonary/Chest: Effort normal and breath sounds normal. No respiratory distress. She has no wheezes. She has no rhonchi. She has no rales.  Abdominal: Soft. Normal appearance. There is no tenderness. There is no CVA tenderness.  Musculoskeletal: Normal range of motion.  Neurological: She is alert and oriented to person, place, and time.  Skin: Skin is warm, dry and intact. No lesion and no rash noted.  Psychiatric: She has a normal mood and affect. Her speech is normal and behavior is normal. Thought content normal.   BP 122/68 mmHg  Pulse 70  Temp(Src) 98.4 F (36.9 C) (Oral)  Resp 17  Ht 5' 4.5" (1.638 m)  Wt 222 lb (100.699 kg)  BMI 37.53 kg/m2  SpO2 98%  LMP  12/12/2014  Results for orders placed or performed in visit on 12/30/14  POCT UA - Microscopic Only  Result Value Ref Range   WBC, Ur, HPF, POC 7-15    RBC, urine, microscopic 0-2    Bacteria, U Microscopic small    Mucus, UA positive    Epithelial cells, urine per micros 4-8    Crystals, Ur, HPF, POC neg    Casts, Ur, LPF, POC neg    Yeast, UA neg    Amorphous moderate   POCT urinalysis dipstick  Result Value Ref Range   Color, UA yellow    Clarity, UA hazy    Glucose, UA neg    Bilirubin, UA neg    Ketones, UA neg    Spec Grav, UA 1.020    Blood, UA large    pH, UA 6.0    Protein, UA 30    Urobilinogen, UA 0.2     Nitrite, UA neg    Leukocytes, UA Trace (A) Negative   Assessment and Plan:  1. Cystitis 2. hematuria UA suggestive of UTI. Urine culture pending. Macrobid prescribed. Counseled on hydration. Return if symptoms not starting to improve in 5-7 days. - nitrofurantoin, macrocrystal-monohydrate, (MACROBID) 100 MG capsule; Take 1 capsule (100 mg total) by mouth 2 (two) times daily.  Dispense: 14 capsule; Refill: 0 - Urine culture - POCT UA - Microscopic Only - POCT urinalysis dipstick   Roswell Miners. Dyke Brackett, MHS [facility]  12/30/2014

## 2014-12-31 NOTE — Addendum Note (Signed)
Addended by: Carmelina Dane on: 12/31/2014 04:56 PM   Modules accepted: Kipp Brood

## 2014-12-31 NOTE — Progress Notes (Signed)
  Medical screening examination/treatment/procedure(s) were performed by non-physician practitioner and as supervising physician I was immediately available for consultation/collaboration.     

## 2015-01-01 LAB — URINE CULTURE: Colony Count: 60000

## 2015-02-09 ENCOUNTER — Ambulatory Visit (INDEPENDENT_AMBULATORY_CARE_PROVIDER_SITE_OTHER): Payer: BLUE CROSS/BLUE SHIELD | Admitting: Family Medicine

## 2015-02-09 VITALS — BP 118/70 | HR 63 | Temp 98.5°F | Resp 16 | Ht 65.5 in | Wt 220.4 lb

## 2015-02-09 DIAGNOSIS — R300 Dysuria: Secondary | ICD-10-CM | POA: Diagnosis not present

## 2015-02-09 LAB — POCT URINALYSIS DIPSTICK
BILIRUBIN UA: NEGATIVE
GLUCOSE UA: NEGATIVE
Ketones, UA: NEGATIVE
NITRITE UA: NEGATIVE
Protein, UA: 30
Spec Grav, UA: 1.02
UROBILINOGEN UA: 1
pH, UA: 5.5

## 2015-02-09 LAB — POCT UA - MICROSCOPIC ONLY
CRYSTALS, UR, HPF, POC: NEGATIVE
Casts, Ur, LPF, POC: NEGATIVE
Mucus, UA: NEGATIVE
YEAST UA: NEGATIVE

## 2015-02-09 MED ORDER — CIPROFLOXACIN HCL 500 MG PO TABS: 500.0000 mg | ORAL_TABLET | Freq: Two times a day (BID) | ORAL | Status: DC

## 2015-02-09 NOTE — Patient Instructions (Signed)

## 2015-02-09 NOTE — Progress Notes (Signed)
Subjective:    Patient ID: Levonne Hubert, female    DOB: 11/25/1965, 49 y.o.   MRN: [id number] This chart was scribed for Nilda Simmer, MD by Littie Deeds, Medical Scribe. This patient was seen in Room 12 and the patient's care was started at 8:37 AM.   02/09/2015  Urinary Frequency; Hematuria; and Dysuria   HPI  HPI Comments: Areil T Insalaco is a 49 y.o. female who [facility] complaining of dysuria that started a few days ago. Patient reports having associated urinary frequency, urinary urgency, and mild diarrhea. She had 3 episodes of nocturia last night; she typically does not have any. She has had some hematuria and back pain today, but she attributes this to starting her menstrual cycle today. Patient denies fever, chills, diaphoresis, nausea, vomiting, vaginal discharge, vaginal pain and vaginal irritation. She has been drinking plenty of fluids, mostly water with sweet tea sometimes. She is married. She was seen here 1 month ago by Lanier Clam, PA-C for similar symptoms and was treated with Macrobid. Patient states she used to have UTI's often, but has not had them recently (except last month) after quitting soft drinks.  Urine culture was negative at visit last month.  Prescribed Macrobid.  Patient also has a cyst on her right wrist, but this does not bother her.   Review of Systems  Constitutional: Negative for fever, chills, diaphoresis and fatigue.  Gastrointestinal: Negative for nausea, vomiting and diarrhea.  Genitourinary: Positive for dysuria, urgency, frequency and hematuria. Negative for flank pain, vaginal bleeding, vaginal discharge, vaginal pain, menstrual problem and pelvic pain.  Musculoskeletal: Positive for back pain.    History reviewed. No pertinent past medical history. Past Surgical History  Procedure Laterality Date  . Tubal ligation     Allergies  Allergen Reactions  . Amoxicillin Rash   History   Social History  . Marital  Status: Married    Spouse Name: N/A  . Number of Children: N/A  . Years of Education: N/A   Occupational History  . Not on file.   Social History Main Topics  . Smoking status: Never Smoker   . Smokeless tobacco: Never Used  . Alcohol Use: No  . Drug Use: No  . Sexual Activity: Yes   Other Topics Concern  . Not on file   Social History Narrative        Objective:    BP 118/70 mmHg  Pulse 63  Temp(Src) 98.5 F (36.9 C) (Oral)  Resp 16  Ht 5' 5.5" (1.664 m)  Wt 220 lb 6.4 oz (99.973 kg)  BMI 36.11 kg/m2  SpO2 98%  LMP 02/09/2015 Physical Exam  Constitutional: She is oriented to person, place, and time. She appears well-developed and well-nourished. No distress.  HENT:  Head: Normocephalic and atraumatic.  Mouth/Throat: No oropharyngeal exudate.  Eyes: Conjunctivae are normal. Pupils are equal, round, and reactive to light.  Neck: Neck supple.  Cardiovascular: Normal rate, regular rhythm and normal heart sounds.   No murmur heard. Pulmonary/Chest: Effort normal and breath sounds normal. No respiratory distress. She has no wheezes. She has no rales.  Clear to auscultation bilaterally.   Abdominal: Soft. Bowel sounds are normal. She exhibits no distension and no mass. There is no hepatosplenomegaly. There is no tenderness. There is no rebound, no guarding and no CVA tenderness.  No suprapubic tenderness.  Musculoskeletal: She exhibits no edema.  +ganglion cyst R wrist.  Neurological: She is alert and oriented  to person, place, and time. No cranial nerve deficit.  Skin: Skin is warm and dry. No rash noted. She is not diaphoretic.  Psychiatric: She has a normal mood and affect. Her behavior is normal.  Vitals reviewed.  Results for orders placed or performed in visit on 02/09/15  POCT urinalysis dipstick  Result Value Ref Range   Color, UA dark yellow    Clarity, UA cloudy    Glucose, UA neg    Bilirubin, UA neg    Ketones, UA neg    Spec Grav, UA 1.020     Blood, UA large    pH, UA 5.5    Protein, UA 30    Urobilinogen, UA 1.0    Nitrite, UA neg    Leukocytes, UA small (1+) (A) Negative  POCT UA - Microscopic Only  Result Value Ref Range   WBC, Ur, HPF, POC 2-4    RBC, urine, microscopic TNTC    Bacteria, U Microscopic 1+    Mucus, UA neg    Epithelial cells, urine per micros 1-3    Crystals, Ur, HPF, POC neg    Casts, Ur, LPF, POC neg    Yeast, UA neg        Assessment & Plan:   1. Dysuria    -New. -Send urine culture. -Treat with Cipro while awaiting culture results. -RTC for fever, vomiting, severe flank pain.   Meds ordered this encounter  Medications  . ciprofloxacin (CIPRO) 500 MG tablet    Sig: Take 1 tablet (500 mg total) by mouth 2 (two) times daily.    Dispense:  14 tablet    Refill:  0    No Follow-up on file.   I personally performed the services described in this documentation, which was scribed in my presence. The recorded information has been reviewed and considered.    Kristi Paulita Fujita, M.D. [facility] [address] [place], [place]  [address] [phone] phone [phone] fax

## 2015-02-10 LAB — URINE CULTURE: Colony Count: 65000

## 2015-03-08 IMAGING — CR DG KNEE 3 VIEWS*R*
3 series · 3 of 3 positions shown · non-contrast
Comparison: None.

CLINICAL DATA: Right knee pain.  No known injury.

RIGHT KNEE - 3 VIEW

[AP]
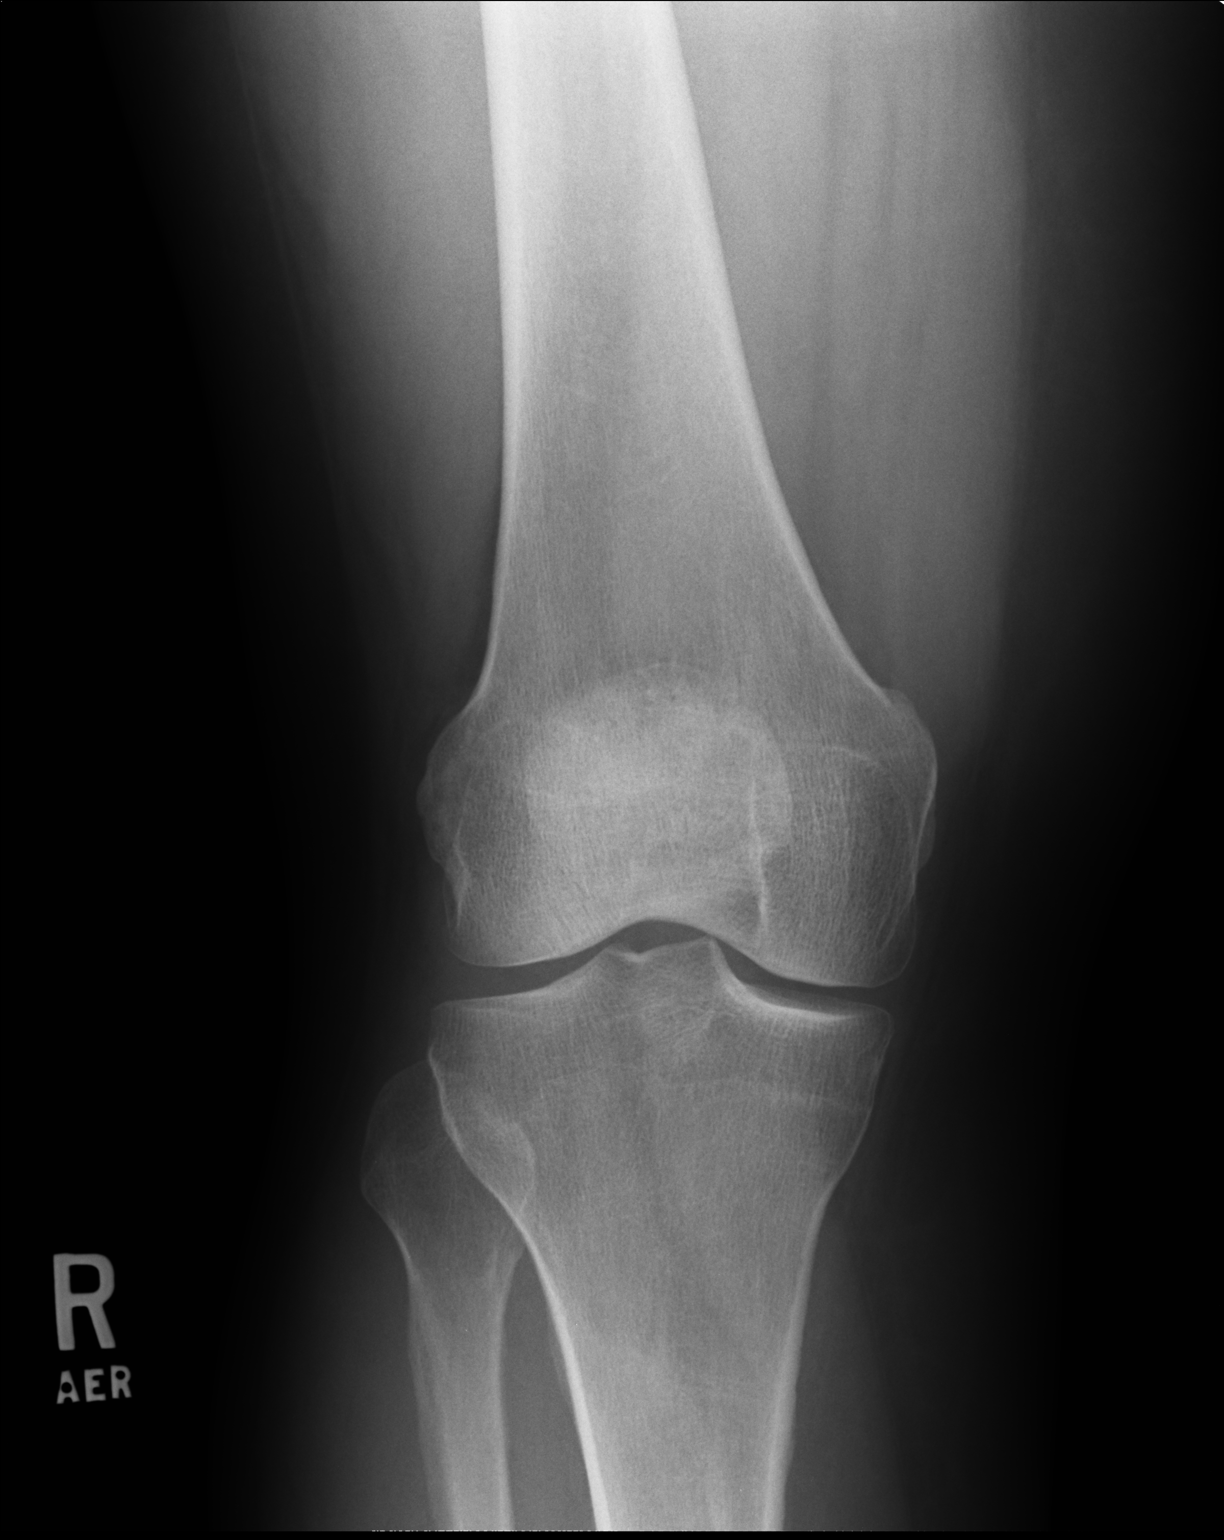

[lateral]
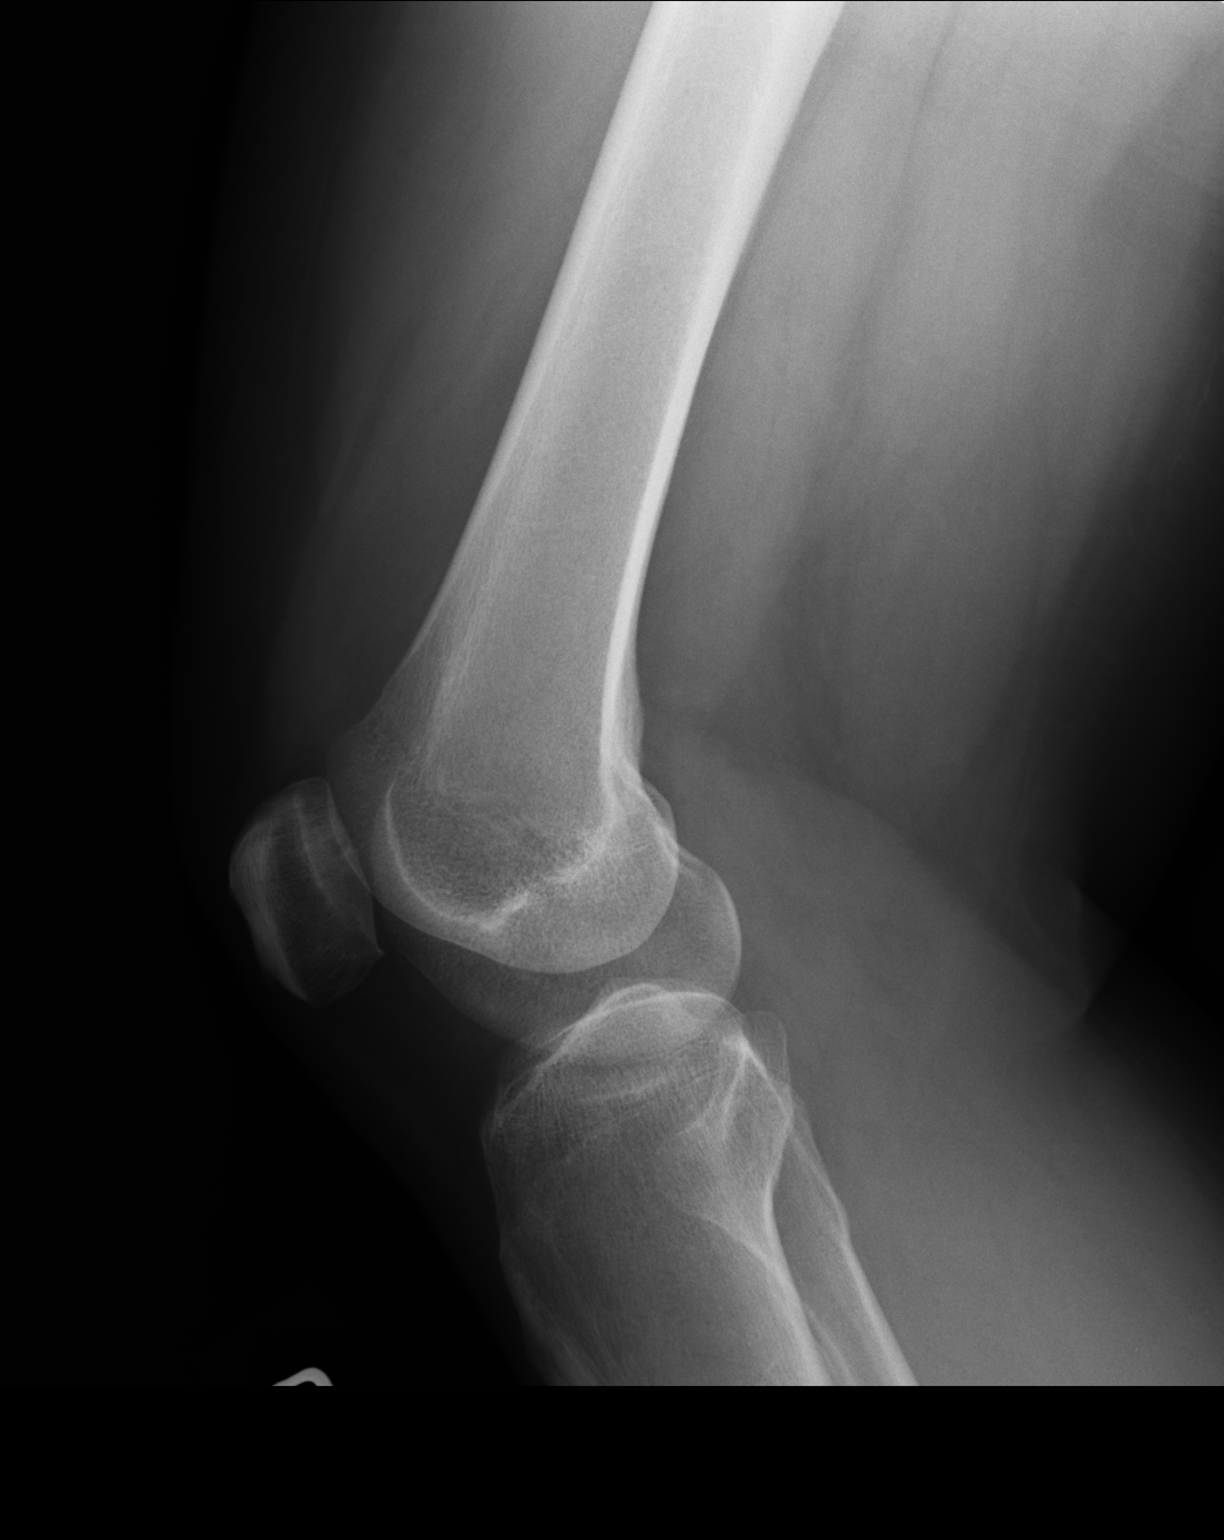

[sunrise]
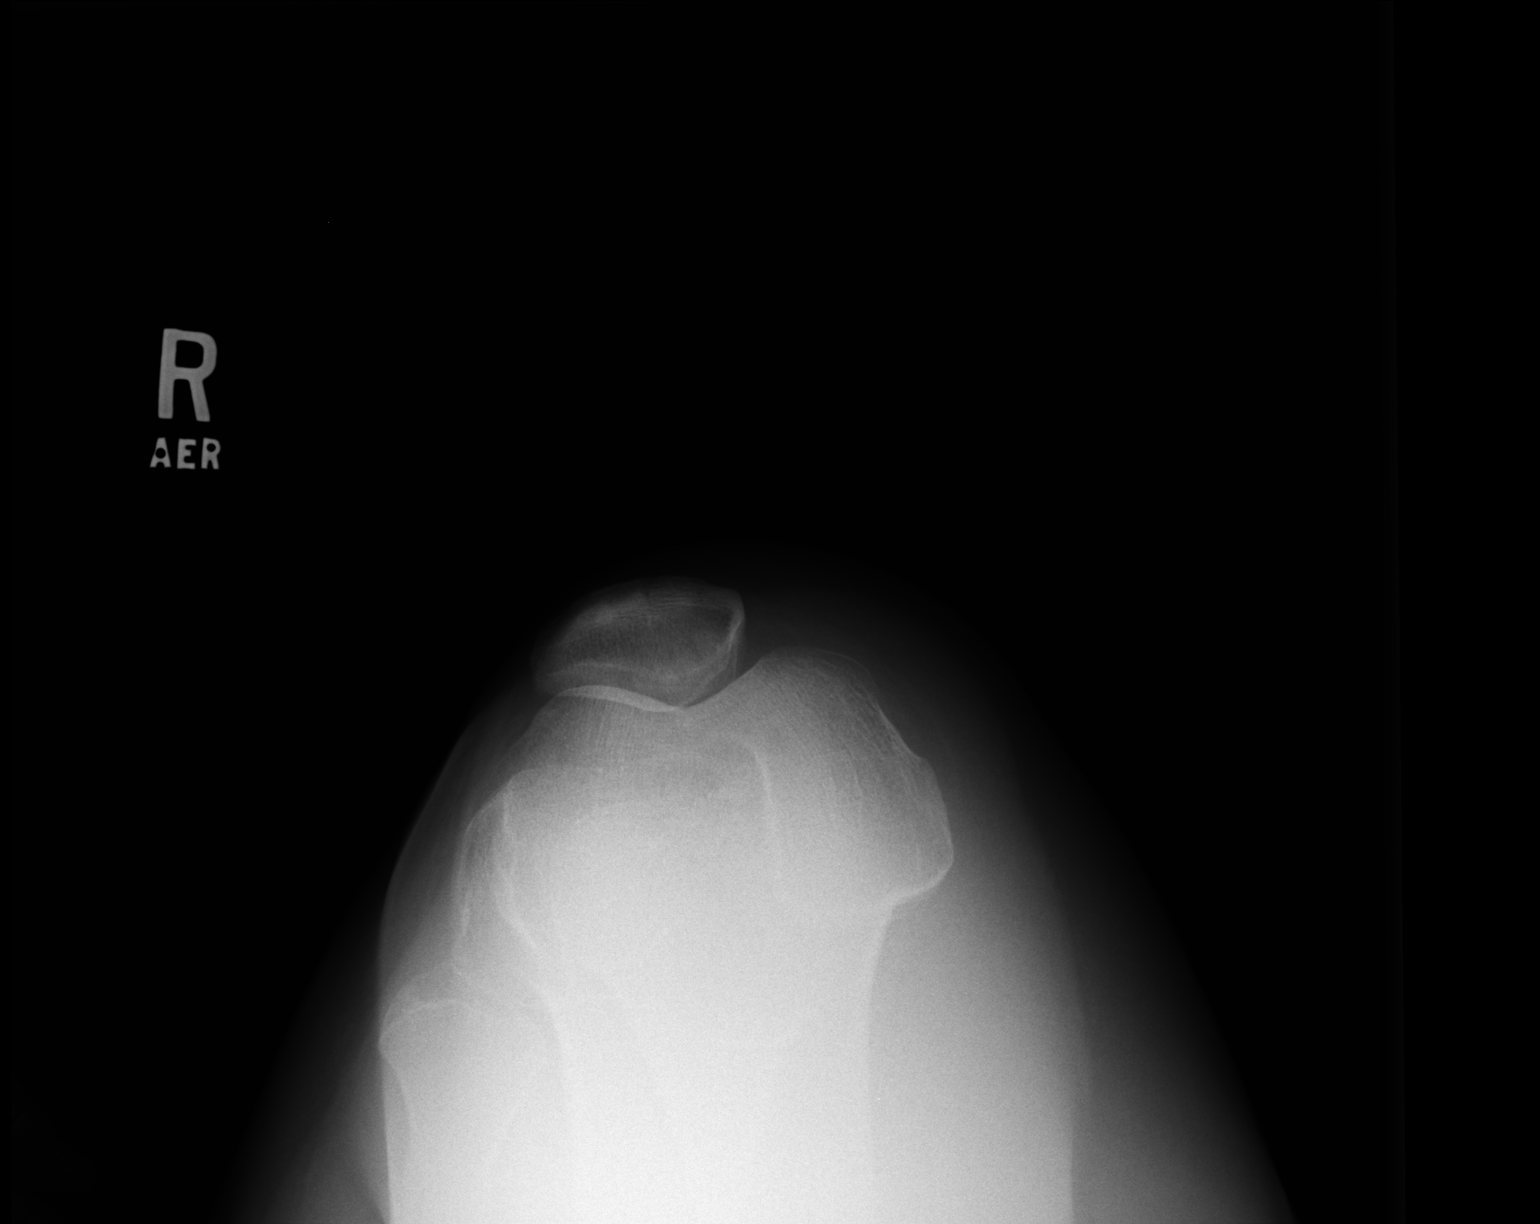

[3 of 3 positions shown; findings below may reference images not displayed]

FINDINGS: No fracture or bone lesion.  The knee joint is normally
spaced and aligned. Evidence of a subchondral cyst along the dorsal
patella with a minimal the inferior marginal patellar osteophyte.
No other degenerative changes evident.  The lateral view is
somewhat rotated.  Allowing for this, there is no convincing joint
effusion.
IMPRESSION: Mild patellofemoral joint space compartment degenerative change.
No other abnormalities.

Clinically significant discrepancy from primary report, if
provided: None

## 2015-12-06 ENCOUNTER — Ambulatory Visit (INDEPENDENT_AMBULATORY_CARE_PROVIDER_SITE_OTHER): Payer: BLUE CROSS/BLUE SHIELD | Admitting: Physician Assistant

## 2015-12-06 VITALS — BP 128/80 | HR 66 | Temp 98.1°F | Resp 18 | Ht 65.5 in | Wt 223.0 lb

## 2015-12-06 DIAGNOSIS — N3289 Other specified disorders of bladder: Secondary | ICD-10-CM

## 2015-12-06 DIAGNOSIS — N3090 Cystitis, unspecified without hematuria: Secondary | ICD-10-CM | POA: Diagnosis not present

## 2015-12-06 DIAGNOSIS — R309 Painful micturition, unspecified: Secondary | ICD-10-CM | POA: Diagnosis not present

## 2015-12-06 DIAGNOSIS — R300 Dysuria: Secondary | ICD-10-CM | POA: Diagnosis not present

## 2015-12-06 DIAGNOSIS — R3989 Other symptoms and signs involving the genitourinary system: Secondary | ICD-10-CM

## 2015-12-06 LAB — POCT URINALYSIS DIP (MANUAL ENTRY)
Bilirubin, UA: NEGATIVE
Glucose, UA: NEGATIVE
Ketones, POC UA: NEGATIVE
NITRITE UA: NEGATIVE
PH UA: 7
Spec Grav, UA: 1.02
Urobilinogen, UA: 0.2

## 2015-12-06 LAB — POC MICROSCOPIC URINALYSIS (UMFC)

## 2015-12-06 MED ORDER — SULFAMETHOXAZOLE-TRIMETHOPRIM 800-160 MG PO TABS: 1.0000 | ORAL_TABLET | Freq: Two times a day (BID) | ORAL | Status: DC

## 2015-12-06 MED ORDER — SULFAMETHOXAZOLE-TRIMETHOPRIM 800-160 MG PO TABS: 1.0000 | ORAL_TABLET | Freq: Two times a day (BID) | ORAL | Status: AC

## 2015-12-06 NOTE — Progress Notes (Signed)
[facility] [address] [place] [address] [phone]  Date:  12/06/2015   Name:  FLORINA GLAS   DOB:  Dec 07, 1965   MRN:  [id number]  PCP:  Nilda Simmer, MD    History of Present Illness:  Charlina Dwight Tenorio is a 50 y.o. female patient who presents to [facility] [place] for cc of dysuria.  Pain started abruptly last night when she felt a sharp pain like a razor blades in her urethral area. She then had urgency and severe pain with urination. There is no hematuria, abdominal pain, back pain, nausea, or fever. She denies any vaginal discharge or odor. She has no diarrhea or constipation. She has done nothing for relief at this time. She has no history of kidney stones.   Sexually active with husband.     Patient Active Problem List   Diagnosis Date Noted  . Right knee pain 03/05/2013  . Morton's neuroma 05/16/2012  . Abnormality of gait 05/16/2012    No past medical history on file.  Past Surgical History  Procedure Laterality Date  . Tubal ligation      Social History  Substance Use Topics  . Smoking status: Never Smoker   . Smokeless tobacco: Never Used  . Alcohol Use: No    Family History  Problem Relation Age of Onset  . Depression Mother   . Bipolar disorder Mother   . Heart disease Father   . Diabetes Father   . Hypertension Father   . Hypertension Maternal Grandmother   . Thyroid disease Maternal Grandmother   . Heart disease Maternal Grandfather   . Hypertension Paternal Grandmother     Allergies  Allergen Reactions  . Amoxicillin Rash    Medication list has been reviewed and updated.  No current outpatient prescriptions on file prior to visit.   No current facility-administered medications on file prior to visit.    ROS ROS otherwise unremarkable unless listed above.  Physical Examination: BP 128/80 mmHg  Pulse 66  Temp(Src) 98.1 F (36.7 C) (Oral)  Resp 18  Ht 5' 5.5" (1.664 m)  Wt 223 lb (101.152 kg)  BMI 36.53 kg/m2  SpO2 66%   LMP 11/29/2015 (Exact Date) Ideal Body Weight: Weight in (lb) to have BMI = 25: 152.2  Physical Exam  Constitutional: She is oriented to person, place, and time. She appears well-developed and well-nourished. No distress.  HENT:  Head: Normocephalic and atraumatic.  Right Ear: External ear normal.  Left Ear: External ear normal.  Eyes: Conjunctivae and EOM are normal. Pupils are equal, round, and reactive to light.  Cardiovascular: Normal rate and regular rhythm.  Exam reveals no gallop.   No murmur heard. Pulmonary/Chest: Effort normal. No apnea. No respiratory distress. She has no decreased breath sounds. She has no wheezes. She has no rhonchi.  Abdominal: Soft. Normal appearance and bowel sounds are normal. There is no tenderness. There is no CVA tenderness.  Neurological: She is alert and oriented to person, place, and time.  Skin: She is not diaphoretic.  Psychiatric: She has a normal mood and affect. Her behavior is normal.    Results for orders placed or performed in visit on 12/06/15  POCT urinalysis dipstick  Result Value Ref Range   Color, UA yellow yellow   Clarity, UA cloudy (A) clear   Glucose, UA negative negative   Bilirubin, UA negative negative   Ketones, POC UA negative negative   Spec Grav, UA 1.020    Blood, UA  trace-intact (A) negative   pH, UA 7.0    Protein Ur, POC =100 (A) negative   Urobilinogen, UA 0.2    Nitrite, UA Negative Negative   Leukocytes, UA small (1+) (A) Negative  POCT Microscopic Urinalysis ([facility])  Result Value Ref Range   WBC,UR,HPF,POC Many (A) None WBC/hpf   RBC,UR,HPF,POC Moderate (A) None RBC/hpf   Bacteria Few (A) None, Too numerous to count   Mucus Present (A) Absent   Epithelial Cells, UR Per Microscopy Few (A) None, Too numerous to count cells/hpf    Assessment and Plan: Eudelia BunchFaith T Wissmann is a 50 y.o. female who is here today for cc of dysuria.    This looks more consistent with a urinary tract infection rather than kidney stone.  I'm placing a urine culture at this time. We will go ahead and start the Bactrim. She has had a history of urinary tract infections that are not completed with 3 day course of empiric treatment. I will go ahead and prescribe for 7 day regimen. Advised heavy hydration and the use of ibuprofen or Tylenol at this time. Cystitis - Plan: sulfamethoxazole-trimethoprim (BACTRIM DS,SEPTRA DS) 800-160 MG tablet, DISCONTINUED: sulfamethoxazole-trimethoprim (BACTRIM DS,SEPTRA DS) 800-160 MG tablet  Sensation of pressure in bladder area - Plan: POCT urinalysis dipstick, POCT Microscopic Urinalysis ([facility])  Painful urination - Plan: POCT urinalysis dipstick, POCT Microscopic Urinalysis ([facility])  Dysuria - Plan: Urine culture  Trena PlattStephanie English, PA-C [facility] 5/27/201711:14 AM

## 2015-12-06 NOTE — Patient Instructions (Addendum)
IF you received an x-ray today, you will receive an invoice from [facility]. Please contact [facility] at [phone] with questions or concerns regarding your invoice.   IF you received labwork today, you will receive an invoice from [employer]. Please contact [facility] at [phone] with questions or concerns regarding your invoice.   Our billing staff will not be able to assist you with questions regarding bills from these companies.  You will be contacted with the lab results as soon as they are available. The fastest way to get your results is to activate your My Chart account. Instructions are located on the last page of this paperwork. If you have not heard from [place] regarding the results in 2 weeks, please contact this office.    Use ibuprofen or tylenol for the pain. I will have the results of your urine culture, and will instruct further with results. Hydrate, hydrate HYDRATE.   Urinary Tract Infection Urinary tract infections (UTIs) can develop anywhere along your urinary tract. Your urinary tract is your body's drainage system for removing wastes and extra water. Your urinary tract includes two kidneys, two ureters, a bladder, and a urethra. Your kidneys are a pair of bean-shaped organs. Each kidney is about the size of your fist. They are located below your ribs, one on each side of your spine. CAUSES Infections are caused by microbes, which are microscopic organisms, including fungi, viruses, and bacteria. These organisms are so small that they can only be seen through a microscope. Bacteria are the microbes that most commonly cause UTIs. SYMPTOMS  Symptoms of UTIs may vary by age and gender of the patient and by the location of the infection. Symptoms in young women typically include a frequent and intense urge to urinate and a painful, burning feeling in the bladder or urethra during urination. Older women and men are more  likely to be tired, shaky, and weak and have muscle aches and abdominal pain. A fever may mean the infection is in your kidneys. Other symptoms of a kidney infection include pain in your back or sides below the ribs, nausea, and vomiting. DIAGNOSIS To diagnose a UTI, your caregiver will ask you about your symptoms. Your caregiver will also ask you to provide a urine sample. The urine sample will be tested for bacteria and white blood cells. White blood cells are made by your body to help fight infection. TREATMENT  Typically, UTIs can be treated with medication. Because most UTIs are caused by a bacterial infection, they usually can be treated with the use of antibiotics. The choice of antibiotic and length of treatment depend on your symptoms and the type of bacteria causing your infection. HOME CARE INSTRUCTIONS  If you were prescribed antibiotics, take them exactly as your caregiver instructs you. Finish the medication even if you feel better after you have only taken some of the medication.  Drink enough water and fluids to keep your urine clear or pale yellow.  Avoid caffeine, tea, and carbonated beverages. They tend to irritate your bladder.  Empty your bladder often. Avoid holding urine for long periods of time.  Empty your bladder before and after sexual intercourse.  After a bowel movement, women should cleanse from front to back. Use each tissue only once. SEEK MEDICAL CARE IF:   You have back pain.  You develop a fever.  Your symptoms do not begin to resolve within 3 days. SEEK IMMEDIATE MEDICAL CARE IF:   You have  severe back pain or lower abdominal pain.  You develop chills.  You have nausea or vomiting.  You have continued burning or discomfort with urination. MAKE SURE YOU:   Understand these instructions.  Will watch your condition.  Will get help right away if you are not doing well or get worse.   This information is not intended to replace advice given to  you by your health care provider. Make sure you discuss any questions you have with your health care provider.   Document Released: 04/07/2005 Document Revised: 03/19/2015 Document Reviewed: 08/06/2011 Elsevier Interactive Patient Education [employer]2016 Elsevier Inc.

## 2015-12-08 LAB — URINE CULTURE

## 2016-02-20 ENCOUNTER — Ambulatory Visit (INDEPENDENT_AMBULATORY_CARE_PROVIDER_SITE_OTHER): Payer: BLUE CROSS/BLUE SHIELD | Admitting: Family Medicine

## 2016-02-20 VITALS — BP 144/64 | HR 76 | Temp 98.5°F | Resp 18 | Ht 64.25 in | Wt 227.8 lb

## 2016-02-20 DIAGNOSIS — R300 Dysuria: Secondary | ICD-10-CM

## 2016-02-20 LAB — POCT URINALYSIS DIP (MANUAL ENTRY)
BILIRUBIN UA: NEGATIVE
BILIRUBIN UA: NEGATIVE
Glucose, UA: NEGATIVE
Nitrite, UA: NEGATIVE
SPEC GRAV UA: 1.01
Urobilinogen, UA: 0.2
pH, UA: 5

## 2016-02-20 LAB — POC MICROSCOPIC URINALYSIS (UMFC): Mucus: ABSENT

## 2016-02-20 MED ORDER — SULFAMETHOXAZOLE-TRIMETHOPRIM 800-160 MG PO TABS: 1.0000 | ORAL_TABLET | Freq: Two times a day (BID) | ORAL | 0 refills | Status: DC

## 2016-02-20 NOTE — Patient Instructions (Addendum)
   IF you received an x-ray today, you will receive an invoice from [facility]. Please contact [facility] at [phone] with questions or concerns regarding your invoice.   IF you received labwork today, you will receive an invoice from [facility]. Please contact [facility] at [phone] with questions or concerns regarding your invoice.   Our billing staff will not be able to assist you with questions regarding bills from these companies.  You will be contacted with the lab results as soon as they are available. The fastest way to get your results is to activate your My Chart account. Instructions are located on the last page of this paperwork. If you have not heard from us regarding the results in 2 weeks, please contact this office.     Urinary Tract Infection Urinary tract infections (UTIs) can develop anywhere along your urinary tract. Your urinary tract is your body's drainage system for removing wastes and extra water. Your urinary tract includes two kidneys, two ureters, a bladder, and a urethra. Your kidneys are a pair of bean-shaped organs. Each kidney is about the size of your fist. They are located below your ribs, one on each side of your spine. CAUSES Infections are caused by microbes, which are microscopic organisms, including fungi, viruses, and bacteria. These organisms are so small that they can only be seen through a microscope. Bacteria are the microbes that most commonly cause UTIs. SYMPTOMS  Symptoms of UTIs may vary by age and gender of the patient and by the location of the infection. Symptoms in young women typically include a frequent and intense urge to urinate and a painful, burning feeling in the bladder or urethra during urination. Older women and men are more likely to be tired, shaky, and weak and have muscle aches and abdominal pain. A fever may mean the infection is in your kidneys. Other symptoms of a kidney  infection include pain in your back or sides below the ribs, nausea, and vomiting. DIAGNOSIS To diagnose a UTI, your caregiver will ask you about your symptoms. Your caregiver will also ask you to provide a urine sample. The urine sample will be tested for bacteria and white blood cells. White blood cells are made by your body to help fight infection. TREATMENT  Typically, UTIs can be treated with medication. Because most UTIs are caused by a bacterial infection, they usually can be treated with the use of antibiotics. The choice of antibiotic and length of treatment depend on your symptoms and the type of bacteria causing your infection. HOME CARE INSTRUCTIONS  If you were prescribed antibiotics, take them exactly as your caregiver instructs you. Finish the medication even if you feel better after you have only taken some of the medication.  Drink enough water and fluids to keep your urine clear or pale yellow.  Avoid caffeine, tea, and carbonated beverages. They tend to irritate your bladder.  Empty your bladder often. Avoid holding urine for long periods of time.  Empty your bladder before and after sexual intercourse.  After a bowel movement, women should cleanse from front to back. Use each tissue only once. SEEK MEDICAL CARE IF:   You have back pain.  You develop a fever.  Your symptoms do not begin to resolve within 3 days. SEEK IMMEDIATE MEDICAL CARE IF:   You have severe back pain or lower abdominal pain.  You develop chills.  You have nausea or vomiting.  You have continued burning or discomfort with urination.   MAKE SURE YOU:   Understand these instructions.  Will watch your condition.  Will get help right away if you are not doing well or get worse.   This information is not intended to replace advice given to you by your health care provider. Make sure you discuss any questions you have with your health care provider.   Document Released: 04/07/2005 Document  Revised: 03/19/2015 Document Reviewed: 08/06/2011 Elsevier Interactive Patient Education 2016 Elsevier Inc.  

## 2016-02-20 NOTE — Progress Notes (Signed)
Patient ID: Mallory Bell, female   DOB: 1965-11-07, 50 y.o.   MRN: [id number]   Subjective:  By signing my name below, I, Stann Ore, attest that this documentation has been prepared under the direction and in the presence of Nilda Simmer, MD. Electronically Signed: Stann Ore, Scribe. 02/20/2016 , 3:06 PM .  Patient was seen in Room 12 .   Patient ID: Mallory Bell, female    DOB: [birth year], 50 y.o.   MRN: [id number]  02/20/2016  Dysuria   HPI Krislynn T Hammonds is a 50 y.o. female who presents to [facility] complaining of dysuria with urgency and frequency that started last night. She had increased frequency of nocturia x3 last night. She was sweating but unsure if she had a fever. She's been drinking plenty of water. She denies taking any medication for this issue. She denies hematuria, chills, nausea, vomiting, back pain, diarrhea, constipation, or vaginal symptoms. She informs having relief from bactrim and cipro for past UTI's.   She's married and will be 23 years in Sept.   Review of Systems  Constitutional: Positive for diaphoresis and fever. Negative for chills and fatigue.  Gastrointestinal: Negative for abdominal pain, diarrhea, nausea and vomiting.  Genitourinary: Positive for dysuria, frequency and urgency. Negative for hematuria and vaginal discharge.  Musculoskeletal: Negative for back pain.    No past medical history on file. Past Surgical History:  Procedure Laterality Date  . TUBAL LIGATION     Allergies  Allergen Reactions  . Amoxicillin Rash    Social History   Social History  . Marital status: Married    Spouse name: N/A  . Number of children: N/A  . Years of education: N/A   Occupational History  . Not on file.   Social History Main Topics  . Smoking status: Never Smoker  . Smokeless tobacco: Never Used  . Alcohol use No  . Drug use: No  . Sexual activity: Yes   Other Topics Concern  . Not on file   Social History Narrative  . No narrative on file     Family History  Problem Relation Age of Onset  . Depression Mother   . Bipolar disorder Mother   . Heart disease Father   . Diabetes Father   . Hypertension Father   . Hypertension Maternal Grandmother   . Thyroid disease Maternal Grandmother   . Heart disease Maternal Grandfather   . Hypertension Paternal Grandmother        Objective:    BP (!) 144/64 (BP Location: Right Arm, Patient Position: Sitting, Cuff Size: Normal)   Pulse 76   Temp 98.5 F (36.9 C) (Oral)   Resp 18   Ht 5' 4.25" (1.632 m)   Wt 227 lb 12.8 oz (103.3 kg)   LMP 02/02/2016   SpO2 99%   BMI 38.80 kg/m   Physical Exam  Constitutional: She is oriented to person, place, and time. She appears well-developed and well-nourished. No distress.  HENT:  Head: Normocephalic and atraumatic.  Eyes: Conjunctivae and EOM are normal. Pupils are equal, round, and reactive to light.  Neck: Normal range of motion. Neck supple.  Cardiovascular: Normal rate, regular rhythm and normal heart sounds.  Exam reveals no gallop and no friction rub.   No murmur heard. Pulmonary/Chest: Effort normal and breath sounds normal. No respiratory distress. She has no wheezes. She has no rales.  Abdominal: Bowel sounds are normal. She exhibits no distension and no mass. There is no tenderness. There is  no rebound, no guarding and no CVA tenderness.  Musculoskeletal: Normal range of motion.  Neurological: She is alert and oriented to person, place, and time.  Skin: Skin is warm and dry. She is not diaphoretic.  Psychiatric: She has a normal mood and affect. Her behavior is normal.  Nursing note and vitals reviewed.       Assessment & Plan:   1. Dysuria     Orders Placed This Encounter  Procedures  . Urine culture  . POCT urinalysis dipstick  . POCT Microscopic Urinalysis ([facility])   Meds ordered this encounter  Medications  . sulfamethoxazole-trimethoprim (BACTRIM DS,SEPTRA DS) 800-160 MG tablet    Sig: Take 1 tablet by  mouth 2 (two) times daily.    Dispense:  14 tablet    Refill:  0    Return if symptoms worsen or fail to improve.    I personally performed the services described in this documentation, which was scribed in my presence. The recorded information has been reviewed and considered.  Patriece Archbold Paulita FujitaMartin Ehab Humber, M.D. [facility]  [place] [address] [place], [place]  [id number] [phone] phone [phone] fax

## 2016-02-23 LAB — URINE CULTURE

## 2016-08-07 ENCOUNTER — Ambulatory Visit (INDEPENDENT_AMBULATORY_CARE_PROVIDER_SITE_OTHER): Payer: BLUE CROSS/BLUE SHIELD | Admitting: Emergency Medicine

## 2016-08-07 VITALS — BP 140/78 | HR 67 | Temp 98.1°F | Resp 18 | Ht 65.0 in | Wt 232.0 lb

## 2016-08-07 DIAGNOSIS — R358 Other polyuria: Secondary | ICD-10-CM

## 2016-08-07 DIAGNOSIS — R350 Frequency of micturition: Secondary | ICD-10-CM

## 2016-08-07 DIAGNOSIS — N3001 Acute cystitis with hematuria: Secondary | ICD-10-CM | POA: Diagnosis not present

## 2016-08-07 DIAGNOSIS — R300 Dysuria: Secondary | ICD-10-CM

## 2016-08-07 LAB — POCT URINALYSIS DIP (MANUAL ENTRY)
Bilirubin, UA: NEGATIVE
GLUCOSE UA: NEGATIVE
Ketones, POC UA: NEGATIVE
NITRITE UA: NEGATIVE
Spec Grav, UA: 1.015
UROBILINOGEN UA: 0.2
pH, UA: 7

## 2016-08-07 LAB — POC MICROSCOPIC URINALYSIS (UMFC)

## 2016-08-07 MED ORDER — CIPROFLOXACIN HCL 500 MG PO TABS: 500.0000 mg | ORAL_TABLET | Freq: Two times a day (BID) | ORAL | 0 refills | Status: DC

## 2016-08-07 NOTE — Progress Notes (Signed)
PCP:Nilda SimmerSMITH,KRISTI, MD Chief Complaint  Patient presents with  . Urinary Tract Infection    Started earlier this week & would feel better by the end of the day, feels better today but still has frequency & burning with urination    Current Issues:  Presents with 5 days of dysuria Associated symptoms include:  urinary frequency and urinary urgency  There is a previous history of of similar symptoms. Sexually active:  Yes with female.   No concern for STI.  Prior to Admission medications   Not on File    Review of Systems:Review of Systems  Constitutional: Negative for chills and fever.  HENT: Negative.   Eyes: Negative for discharge and redness.  Respiratory: Negative for cough and shortness of breath.   Cardiovascular: Negative for chest pain and palpitations.  Gastrointestinal: Negative for abdominal pain, diarrhea, nausea and vomiting.  Genitourinary: Positive for dysuria, flank pain, frequency and urgency. Negative for hematuria.  Skin: Negative for rash.  Neurological: Negative.   Endo/Heme/Allergies: Negative.   All other systems reviewed and are negative.    PE:  BP 140/78   Pulse 67   Temp 98.1 F (36.7 C) (Oral)   Resp 18   Ht 5\' 5"  (1.651 m)   Wt 232 lb (105.2 kg)   SpO2 98%   BMI 38.61 kg/m  Physical Exam  Constitutional: She is oriented to person, place, and time. She appears well-developed and well-nourished.  HENT:  Head: Normocephalic and atraumatic.  Eyes: EOM are normal. Pupils are equal, round, and reactive to light.  Neck: Normal range of motion.  Cardiovascular: Normal rate and normal heart sounds.   Pulmonary/Chest: Effort normal and breath sounds normal.  Abdominal: Soft. She exhibits no distension. There is no tenderness.  Musculoskeletal: Normal range of motion.  Neurological: She is alert and oriented to person, place, and time.  Skin: Skin is warm and dry.  Psychiatric: She has a normal mood and affect. Her behavior is normal.  Vitals  reviewed.   Results for orders placed or performed in visit on 08/07/16  POCT Microscopic Urinalysis ([facility])  Result Value Ref Range   WBC,UR,HPF,POC Moderate (A) None WBC/hpf   RBC,UR,HPF,POC Few (A) None RBC/hpf   Bacteria Moderate (A) None, Too numerous to count   Mucus Present (A) Absent   Epithelial Cells, UR Per Microscopy Many (A) None, Too numerous to count cells/hpf  POCT urinalysis dipstick  Result Value Ref Range   Color, UA yellow yellow   Clarity, UA clear clear   Glucose, UA negative negative   Bilirubin, UA negative negative   Ketones, POC UA negative negative   Spec Grav, UA 1.015    Blood, UA trace-intact (A) negative   pH, UA 7.0    Protein Ur, POC trace (A) negative   Urobilinogen, UA 0.2    Nitrite, UA Negative Negative   Leukocytes, UA small (1+) (A) Negative    Assessment and Plan:  1. Burning with urination  - POCT Microscopic Urinalysis ([facility]) - POCT urinalysis dipstick  2. Frequency of urination  - POCT Microscopic Urinalysis ([facility]) - POCT urinalysis dipstick   Patient Instructions       IF you received an x-ray today, you will receive an invoice from [facility]. Please contact [facility] at [phone] with questions or concerns regarding your invoice.   IF you received labwork today, you will receive an invoice from [facility]. Please contact [facility] at [phone] with questions or concerns regarding your invoice.   Our billing  staff will not be able to assist you with questions regarding bills from these companies.  You will be contacted with the lab results as soon as they are available. The fastest way to get your results is to activate your My Chart account. Instructions are located on the last page of this paperwork. If you have not heard from [place] regarding the results in 2 weeks, please contact this office.         Urinary Tract Infection, Adult Introduction A urinary tract infection (UTI) is an infection  of any part of the urinary tract. The urinary tract includes the:  Kidneys.  Ureters.  Bladder.  Urethra. These organs make, store, and get rid of pee (urine) in the body. Follow these instructions at home:  Take over-the-counter and prescription medicines only as told by your doctor.  If you were prescribed an antibiotic medicine, take it as told by your doctor. Do not stop taking the antibiotic even if you start to feel better.  Avoid the following drinks:  Alcohol.  Caffeine.  Tea.  Carbonated drinks.  Drink enough fluid to keep your pee clear or pale yellow.  Keep all follow-up visits as told by your doctor. This is important.  Make sure to:  Empty your bladder often and completely. Do not to hold pee for long periods of time.  Empty your bladder before and after sex.  Wipe from front to back after a bowel movement if you are female. Use each tissue one time when you wipe. Contact a doctor if:  You have back pain.  You have a fever.  You feel sick to your stomach (nauseous).  You throw up (vomit).  Your symptoms do not get better after 3 days.  Your symptoms go away and then come back. Get help right away if:  You have very bad back pain.  You have very bad lower belly (abdominal) pain.  You are throwing up and cannot keep down any medicines or water. This information is not intended to replace advice given to you by your health care provider. Make sure you discuss any questions you have with your health care provider. Document Released: 12/15/2007 Document Revised: 12/04/2015 Document Reviewed: 05/19/2015  2017 Elsevier

## 2016-08-07 NOTE — Patient Instructions (Addendum)
     IF you received an x-ray today, you will receive an invoice from [facility]. Please contact [facility] at [phone] with questions or concerns regarding your invoice.   IF you received labwork today, you will receive an invoice from [facility]. Please contact [facility] at [phone] with questions or concerns regarding your invoice.   Our billing staff will not be able to assist you with questions regarding bills from these companies.  You will be contacted with the lab results as soon as they are available. The fastest way to get your results is to activate your My Chart account. Instructions are located on the last page of this paperwork. If you have not heard from us regarding the results in 2 weeks, please contact this office.     Urinary Tract Infection, Adult Introduction A urinary tract infection (UTI) is an infection of any part of the urinary tract. The urinary tract includes the:  Kidneys.  Ureters.  Bladder.  Urethra. These organs make, store, and get rid of pee (urine) in the body. Follow these instructions at home:  Take over-the-counter and prescription medicines only as told by your doctor.  If you were prescribed an antibiotic medicine, take it as told by your doctor. Do not stop taking the antibiotic even if you start to feel better.  Avoid the following drinks:  Alcohol.  Caffeine.  Tea.  Carbonated drinks.  Drink enough fluid to keep your pee clear or pale yellow.  Keep all follow-up visits as told by your doctor. This is important.  Make sure to:  Empty your bladder often and completely. Do not to hold pee for long periods of time.  Empty your bladder before and after sex.  Wipe from front to back after a bowel movement if you are female. Use each tissue one time when you wipe. Contact a doctor if:  You have back pain.  You have a fever.  You feel sick to your stomach (nauseous).  You throw up (vomit).  Your  symptoms do not get better after 3 days.  Your symptoms go away and then come back. Get help right away if:  You have very bad back pain.  You have very bad lower belly (abdominal) pain.  You are throwing up and cannot keep down any medicines or water. This information is not intended to replace advice given to you by your health care provider. Make sure you discuss any questions you have with your health care provider. Document Released: 12/15/2007 Document Revised: 12/04/2015 Document Reviewed: 05/19/2015  2017 Elsevier  

## 2016-08-09 LAB — URINE CULTURE

## 2016-08-16 ENCOUNTER — Encounter: Payer: Self-pay | Admitting: *Deleted

## 2016-11-22 ENCOUNTER — Encounter: Payer: Self-pay | Admitting: Emergency Medicine

## 2016-11-22 ENCOUNTER — Ambulatory Visit (INDEPENDENT_AMBULATORY_CARE_PROVIDER_SITE_OTHER): Payer: BLUE CROSS/BLUE SHIELD | Admitting: Emergency Medicine

## 2016-11-22 VITALS — BP 138/78 | HR 80 | Temp 99.0°F | Resp 17 | Ht 64.5 in | Wt 229.0 lb

## 2016-11-22 DIAGNOSIS — L237 Allergic contact dermatitis due to plants, except food: Secondary | ICD-10-CM | POA: Diagnosis not present

## 2016-11-22 DIAGNOSIS — L299 Pruritus, unspecified: Secondary | ICD-10-CM

## 2016-11-22 MED ORDER — TRIAMCINOLONE ACETONIDE 0.1 % EX CREA: 1.0000 "application " | TOPICAL_CREAM | Freq: Two times a day (BID) | CUTANEOUS | 0 refills | Status: AC

## 2016-11-22 MED ORDER — PREDNISONE 20 MG PO TABS: 40.0000 mg | ORAL_TABLET | Freq: Every day | ORAL | 0 refills | Status: AC

## 2016-11-22 NOTE — Patient Instructions (Addendum)
   IF you received an x-ray today, you will receive an invoice from [facility]. Please contact [facility] at [phone] with questions or concerns regarding your invoice.   IF you received labwork today, you will receive an invoice from [facility]. Please contact [facility] at [phone] with questions or concerns regarding your invoice.   Our billing staff will not be able to assist you with questions regarding bills from these companies.  You will be contacted with the lab results as soon as they are available. The fastest way to get your results is to activate your My Chart account. Instructions are located on the last page of this paperwork. If you have not heard from us regarding the results in 2 weeks, please contact this office.      Poison Ivy Dermatitis Poison ivy dermatitis is redness and soreness (inflammation) of the skin. It is caused by a chemical that is found on the leaves of the poison ivy plant. You may also have itching, a rash, and blisters. Symptoms often clear up in 1-2 weeks. You may get this condition by touching a poison ivy plant. You can also get it by touching something that has the chemical on it. This may include animals or objects that have come in contact with the plant. Follow these instructions at home: General instructions   Take or apply over-the-counter and prescription medicines only as told by your doctor.  If you touch poison ivy, wash your skin with soap and cold water right away.  Use hydrocortisone creams or calamine lotion as needed to help with itching.  Take oatmeal baths as needed. Use colloidal oatmeal. You can get this at a pharmacy or grocery store. Follow the instructions on the package.  Do not scratch or rub your skin.  While you have the rash, wash your clothes right after you wear them. Prevention   Know what poison ivy looks like so you can avoid it. This plant has three leaves with flowering branches on a  single stem. The leaves are glossy. They have uneven edges that come to a point at the front.  If you have touched poison ivy, wash with soap and water right away. Be sure to wash under your fingernails.  When hiking or camping, wear long pants, a long-sleeved shirt, tall socks, and hiking boots. You can also use a lotion on your skin that helps to prevent contact with the chemical on the plant.  If you think that your clothes or outdoor gear came in contact with poison ivy, rinse them off with a garden hose before you bring them inside your house. Contact a doctor if:  You have open sores in the rash area.  You have more redness, swelling, or pain in the affected area.  You have redness that spreads beyond the rash area.  You have fluid, blood, or pus coming from the affected area.  You have a fever.  You have a rash over a large area of your body.  You have a rash on your eyes, mouth, or genitals.  Your rash does not get better after a few days. Get help right away if:  Your face swells or your eyes swell shut.  You have trouble breathing.  You have trouble swallowing. This information is not intended to replace advice given to you by your health care provider. Make sure you discuss any questions you have with your health care provider. Document Released: 07/31/2010 Document Revised: 12/04/2015 Document Reviewed: 12/04/2014 Elsevier Interactive   Patient Education  2017 Elsevier Inc.  

## 2016-11-22 NOTE — Progress Notes (Signed)
Mallory Bell 51 y.o.   Chief Complaint  Patient presents with  . Poison Ivy    HISTORY OF PRESENT ILLNESS: This is a 51 y.o. female complaining of poison ivy exposure that happened 3 days ago.  Poison Mallory Bell  This is a new problem. The current episode started in the past 7 days. The problem has been gradually worsening since onset. The affected locations include the left upper leg, right wrist, right lower leg and left hip. The rash is characterized by blistering, burning, itchiness, redness and swelling. She was exposed to plant contact. Pertinent negatives include no congestion, cough, diarrhea, facial edema, fever, rhinorrhea, shortness of breath, sore throat or vomiting. Past treatments include anti-itch cream. The treatment provided no relief.     Prior to Admission medications   Not on File    Allergies  Allergen Reactions  . Amoxicillin Rash    Patient Active Problem List   Diagnosis Date Noted  . Acute cystitis with hematuria 08/07/2016  . Right knee pain 03/05/2013  . Morton's neuroma 05/16/2012  . Abnormality of gait 05/16/2012    No past medical history on file.  Past Surgical History:  Procedure Laterality Date  . TUBAL LIGATION      Social History   Social History  . Marital status: Married    Spouse name: N/A  . Number of children: N/A  . Years of education: N/A   Occupational History  . Not on file.   Social History Main Topics  . Smoking status: Never Smoker  . Smokeless tobacco: Never Used  . Alcohol use No  . Drug use: No  . Sexual activity: Yes   Other Topics Concern  . Not on file   Social History Narrative  . No narrative on file    Family History  Problem Relation Age of Onset  . Depression Mother   . Bipolar disorder Mother   . Heart disease Father   . Diabetes Father   . Hypertension Father   . Hypertension Maternal Grandmother   . Thyroid disease Maternal Grandmother   . Heart disease Maternal Grandfather   .  Hypertension Paternal Grandmother      Review of Systems  Constitutional: Negative for chills, fever and malaise/fatigue.  HENT: Negative for congestion, nosebleeds, rhinorrhea, sinus pain and sore throat.   Eyes: Negative.   Respiratory: Negative for cough, shortness of breath and wheezing.   Cardiovascular: Negative.  Negative for chest pain and palpitations.  Gastrointestinal: Negative for diarrhea, nausea and vomiting.  Genitourinary: Negative.  Negative for dysuria and hematuria.  Musculoskeletal: Negative for back pain, myalgias and neck pain.  Skin: Positive for itching and rash.  Neurological: Negative.  Negative for dizziness and headaches.  All other systems reviewed and are negative.  Vitals:   11/22/16 1043  BP: 138/78  Pulse: 80  Resp: 17  Temp: 99 F (37.2 C)     Physical Exam  Constitutional: She is oriented to person, place, and time. She appears well-developed and well-nourished.  HENT:  Head: Normocephalic and atraumatic.  Right Ear: External ear normal.  Left Ear: External ear normal.  Nose: Nose normal.  Mouth/Throat: Oropharynx is clear and moist. No oropharyngeal exudate.  Eyes: Conjunctivae and EOM are normal. Pupils are equal, round, and reactive to light.  Neck: Normal range of motion. Neck supple. No JVD present.  Cardiovascular: Normal rate, regular rhythm and normal heart sounds.   Pulmonary/Chest: Effort normal and breath sounds normal.  Abdominal: Soft. She exhibits no  distension. There is no tenderness.  Musculoskeletal: Normal range of motion.  Lymphadenopathy:    She has no cervical adenopathy.  Neurological: She is alert and oriented to person, place, and time. No sensory deficit. She exhibits normal muscle tone.  Skin: Skin is warm. Capillary refill takes less than 2 seconds. Rash noted.  Poison ivy rash left upper thigh, right forearm, right lower leg, left flank.  Psychiatric: She has a normal mood and affect. Her behavior is normal.    Vitals reviewed.    ASSESSMENT & PLAN: Mallory Bell was seen today for poison ivy.  Diagnoses and all orders for this visit:  Poison ivy dermatitis  Itching  Other orders -     predniSONE (DELTASONE) 20 MG tablet; Take 2 tablets (40 mg total) by mouth daily with breakfast. -     triamcinolone cream (KENALOG) 0.1 %; Apply 1 application topically 2 (two) times daily.    Patient Instructions       IF you received an x-ray today, you will receive an invoice from [facility]. Please contact [facility] at [phone] with questions or concerns regarding your invoice.   IF you received labwork today, you will receive an invoice from [facility]. Please contact [facility] at [phone] with questions or concerns regarding your invoice.   Our billing staff will not be able to assist you with questions regarding bills from these companies.  You will be contacted with the lab results as soon as they are available. The fastest way to get your results is to activate your My Chart account. Instructions are located on the last page of this paperwork. If you have not heard from [place] regarding the results in 2 weeks, please contact this office.      Poison Ivy Dermatitis Poison ivy dermatitis is redness and soreness (inflammation) of the skin. It is caused by a chemical that is found on the leaves of the poison ivy plant. You may also have itching, a rash, and blisters. Symptoms often clear up in 1-2 weeks. You may get this condition by touching a poison ivy plant. You can also get it by touching something that has the chemical on it. This may include animals or objects that have come in contact with the plant. Follow these instructions at home: General instructions   Take or apply over-the-counter and prescription medicines only as told by your doctor.  If you touch poison ivy, wash your skin with soap and cold water right away.  Use hydrocortisone creams or calamine lotion as  needed to help with itching.  Take oatmeal baths as needed. Use colloidal oatmeal. You can get this at a pharmacy or grocery store. Follow the instructions on the package.  Do not scratch or rub your skin.  While you have the rash, wash your clothes right after you wear them. Prevention   Know what poison ivy looks like so you can avoid it. This plant has three leaves with flowering branches on a single stem. The leaves are glossy. They have uneven edges that come to a point at the front.  If you have touched poison ivy, wash with soap and water right away. Be sure to wash under your fingernails.  When hiking or camping, wear long pants, a long-sleeved shirt, tall socks, and hiking boots. You can also use a lotion on your skin that helps to prevent contact with the chemical on the plant.  If you think that your clothes or outdoor gear came in contact with poison ivy,  rinse them off with a garden hose before you bring them inside your house. Contact a doctor if:  You have open sores in the rash area.  You have more redness, swelling, or pain in the affected area.  You have redness that spreads beyond the rash area.  You have fluid, blood, or pus coming from the affected area.  You have a fever.  You have a rash over a large area of your body.  You have a rash on your eyes, mouth, or genitals.  Your rash does not get better after a few days. Get help right away if:  Your face swells or your eyes swell shut.  You have trouble breathing.  You have trouble swallowing. This information is not intended to replace advice given to you by your health care provider. Make sure you discuss any questions you have with your health care provider. Document Released: 07/31/2010 Document Revised: 12/04/2015 Document Reviewed: 12/04/2014 Elsevier Interactive Patient Education  2017 Elsevier Inc.       Edwina BarthMiguel Sheriann Newmann, MD [facility]

## 2017-08-23 ENCOUNTER — Ambulatory Visit: Payer: BLUE CROSS/BLUE SHIELD | Admitting: Physician Assistant

## 2017-12-01 ENCOUNTER — Encounter: Payer: Self-pay | Admitting: Family Medicine

## 2019-03-29 ENCOUNTER — Other Ambulatory Visit: Payer: Self-pay | Admitting: Nurse Practitioner

## 2019-03-29 DIAGNOSIS — Z1231 Encounter for screening mammogram for malignant neoplasm of breast: Secondary | ICD-10-CM

## 2022-06-07 ENCOUNTER — Telehealth: Payer: Self-pay | Admitting: Physician Assistant

## 2022-06-07 DIAGNOSIS — Z20822 Contact with and (suspected) exposure to covid-19: Secondary | ICD-10-CM

## 2022-06-07 MED ORDER — BENZONATATE 100 MG PO CAPS: 100.0000 mg | ORAL_CAPSULE | Freq: Three times a day (TID) | ORAL | 0 refills | Status: AC

## 2022-06-07 MED ORDER — MOLNUPIRAVIR EUA 200MG CAPSULE: 4.0000 | ORAL_CAPSULE | Freq: Two times a day (BID) | ORAL | 0 refills | Status: AC

## 2022-06-07 MED ORDER — GUAIFENESIN ER 600 MG PO TB12: 600.0000 mg | ORAL_TABLET | Freq: Two times a day (BID) | ORAL | 0 refills | Status: AC

## 2022-06-07 NOTE — Patient Instructions (Signed)
  Maycel T Mahl, thank you for joining [employer], PA-C for today's virtual visit.  While this provider is not your primary care provider (PCP), if your PCP is located in our provider database this encounter information will be shared with them immediately following your visit.   A [facility] MyChart account gives you access to today's visit and all your visits, tests, and labs performed at [facility] " click here if you don't have a [facility] MyChart account or go to [email or web address]  Consent: (Patient) Mallory Bell provided verbal consent for this virtual visit at the beginning of the encounter.  Current Medications:  Current Outpatient Medications:    triamcinolone cream (KENALOG) 0.1 %, Apply 1 application topically 2 (two) times daily., Disp: 30 g, Rfl: 0   Medications ordered in this encounter:  No orders of the defined types were placed in this encounter.    *If you need refills on other medications prior to your next appointment, please contact your pharmacy*  Follow-Up: Call back or seek an in-person evaluation if the symptoms worsen or if the condition fails to improve as anticipated.  [facility] [phone]  Other Instructions Take molnupirovir, tessalon, mucinex as directed   Follow up with your regular doctor in 1 week for reassessment and seek care sooner if your symptoms worsen or fail to improve.   If you have been instructed to have an in-person evaluation today at a local Urgent Care facility, please use the link below. It will take you to a list of all of our available [facility], including address, phone number and hours of operation. Please do not delay care.  [facility]  If you or a family member do not have a primary care provider, use the link below to schedule a visit and establish care. When you choose a [facility] primary care physician or advanced practice provider, you gain a  long-term partner in health. Find a Primary Care Provider  Learn more about [facility]'s in-office and virtual care options:  - Get Care Now

## 2022-06-07 NOTE — Progress Notes (Signed)
Virtual Visit Consent   Mallory Bell, you are scheduled for a virtual visit with a [facility] provider today. Just as with appointments in the office, your consent must be obtained to participate. Your consent will be active for this visit and any virtual visit you may have with one of our providers in the next 365 days. If you have a MyChart account, a copy of this consent can be sent to you electronically.  As this is a virtual visit, video technology does not allow for your provider to perform a traditional examination. This may limit your provider's ability to fully assess your condition. If your provider identifies any concerns that need to be evaluated in person or the need to arrange testing (such as labs, EKG, etc.), we will make arrangements to do so. Although advances in technology are sophisticated, we cannot ensure that it will always work on either your end or our end. If the connection with a video visit is poor, the visit may have to be switched to a telephone visit. With either a video or telephone visit, we are not always able to ensure that we have a secure connection.  By engaging in this virtual visit, you consent to the provision of healthcare and authorize for your insurance to be billed (if applicable) for the services provided during this visit. Depending on your insurance coverage, you may receive a charge related to this service.  I need to obtain your verbal consent now. Are you willing to proceed with your visit today? Mallory Bell has provided verbal consent on 06/07/2022 for a virtual visit (video or telephone). Mallory Bell, [place]  Date: 06/07/2022 10:38 AM  Virtual Visit via Video Note   I, Mallory Bell, connected with  Mallory Bell  ([id number], 1966/03/06) on 06/07/22 at 10:30 AM EST by a video-enabled telemedicine application and verified that I am speaking with the correct person using two identifiers.  Location: Patient: Virtual Visit Location Patient:  Home Provider: Virtual Visit Location Provider: Home Office   I discussed the limitations of evaluation and management by telemedicine and the availability of in person appointments. The patient expressed understanding and agreed to proceed.    History of Present Illness: Mallory Bell is a 56 y.o. who identifies as a female who was assigned female at birth, and is being seen today for uri sxs.  Pt states she has had congestion, fever, cough for the last 3 days. Denies vomiting, diarrhea, sob, chest pain. She was recently exposed to COVID   She has tried otc medications without resolution of symptoms. She has not taken a covid test yet.   HPI: HPI  Problems:  Patient Active Problem List   Diagnosis Date Noted   Poison ivy dermatitis 11/22/2016   Itching 11/22/2016   Morton's neuroma 05/16/2012   Abnormality of gait 05/16/2012    Allergies:  Allergies  Allergen Reactions   Amoxicillin Rash   Medications:  Current Outpatient Medications:    benzonatate (TESSALON) 100 MG capsule, Take 1 capsule (100 mg total) by mouth every 8 (eight) hours for 5 days., Disp: 15 capsule, Rfl: 0   guaiFENesin (MUCINEX) 600 MG 12 hr tablet, Take 1 tablet (600 mg total) by mouth 2 (two) times daily., Disp: 6 tablet, Rfl: 0   molnupiravir EUA (LAGEVRIO) 200 mg CAPS capsule, Take 4 capsules (800 mg total) by mouth 2 (two) times daily for 5 days., Disp: 40 capsule, Rfl: 0   triamcinolone cream (KENALOG) 0.1 %,  Apply 1 application topically 2 (two) times daily., Disp: 30 g, Rfl: 0  Observations/Objective: Patient is well-developed, well-nourished in no acute distress.  Resting comfortably at home.  Head is normocephalic, atraumatic.  No labored breathing. Speaking in full sentences. Speech is clear and coherent with logical content.  Patient is alert and oriented at baseline.    Assessment and Plan: 1. Encounter by telehealth for suspected COVID-19  No prior labs to check renal fxn for paxlovid.  Discussed molnupirovir. Advised pt needs to test for covid prior to taking this med. She is in agreement to do so.   Follow Up Instructions: I discussed the assessment and treatment plan with the patient. The patient was provided an opportunity to ask questions and all were answered. The patient agreed with the plan and demonstrated an understanding of the instructions.  A copy of instructions were sent to the patient via MyChart unless otherwise noted below.   The patient was advised to call back or seek an in-person evaluation if the symptoms worsen or if the condition fails to improve as anticipated.  Time:  I spent 10 minutes with the patient via telehealth technology discussing the above problems/concerns.    [name], PA-C

## 2024-01-10 ENCOUNTER — Ambulatory Visit: Attending: Nurse Practitioner | Admitting: Audiologist

## 2024-01-10 DIAGNOSIS — H6123 Impacted cerumen, bilateral: Secondary | ICD-10-CM | POA: Diagnosis present

## 2024-01-10 NOTE — Procedures (Signed)
  Outpatient Audiology and [facility] [address] [place], [place]  [address] [phone]  AUDIOLOGICAL  EVALUATION  NAME: Mallory Bell     DOB:   Sep 18, 1965      MRN: [id number]                                                                                     DATE: 01/10/2024     REFERENT: Vernell Pass NP  STATUS: Outpatient DIAGNOSIS: Impacted Cerumen Bilateral    History: Mallory Bell was seen for an audiological evaluation due to difficulty hearing. 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 Recently she cannot hear well from the left ear either. She has to concentrate in noise to understand people. One on one she does ok.  Mallory Bell denies pain, pressure, or tinnitus.  Mallory Bell no history of hazardous noise exposure.  Medical history shows no risk for hearing loss.    Evaluation:  Otoscopy showed a no view of the tympanic membranes due to cerumen impaction, bilaterally Tympanometry results were consistent with flat response with abnormal volume in both ears  Results:  The test results were reviewed with Mallory Bell. She had impacted wax in both ears. This needs to be removed for an accurate hearing test.  Recommendations: Call PCP and request cerumen removal for both ears. If hearing better after removal, then no need for hearing testing. If still struggling then call back to reschedule hearing evaluation post removal.     10 minutes spent testing and counseling on results.   If you have any questions please feel free to contact me at [phone].  Lauraine Ka Stalnaker Au.D.  Audiologist   01/10/2024  8:09 AM  Cc: No primary care provider on file.
